# Patient Record
Sex: Female | Born: 1965 | Race: White | Hispanic: No | Smoking: Former smoker
Health system: Southern US, Community
[De-identification: ages and names within clinical notes are randomized; demographics above are authoritative.]

## PROBLEM LIST (undated history)

## (undated) DIAGNOSIS — F909 Attention-deficit hyperactivity disorder, unspecified type: Secondary | ICD-10-CM

## (undated) DIAGNOSIS — E039 Hypothyroidism, unspecified: Secondary | ICD-10-CM

## (undated) DIAGNOSIS — S61218A Laceration without foreign body of other finger without damage to nail, initial encounter: Secondary | ICD-10-CM

## (undated) DIAGNOSIS — F319 Bipolar disorder, unspecified: Secondary | ICD-10-CM

## (undated) DIAGNOSIS — F32A Depression, unspecified: Secondary | ICD-10-CM

## (undated) DIAGNOSIS — E119 Type 2 diabetes mellitus without complications: Secondary | ICD-10-CM

## (undated) DIAGNOSIS — K219 Gastro-esophageal reflux disease without esophagitis: Secondary | ICD-10-CM

## (undated) DIAGNOSIS — F419 Anxiety disorder, unspecified: Secondary | ICD-10-CM

## (undated) DIAGNOSIS — A159 Respiratory tuberculosis unspecified: Secondary | ICD-10-CM

## (undated) DIAGNOSIS — F988 Other specified behavioral and emotional disorders with onset usually occurring in childhood and adolescence: Secondary | ICD-10-CM

## (undated) DIAGNOSIS — G4700 Insomnia, unspecified: Secondary | ICD-10-CM

## (undated) DIAGNOSIS — F329 Major depressive disorder, single episode, unspecified: Secondary | ICD-10-CM

## (undated) DIAGNOSIS — I10 Essential (primary) hypertension: Secondary | ICD-10-CM

## (undated) DIAGNOSIS — E785 Hyperlipidemia, unspecified: Secondary | ICD-10-CM

## (undated) HISTORY — PX: POLYPECTOMY: SHX149

## (undated) HISTORY — PX: COLONOSCOPY: SHX174

## (undated) HISTORY — DX: Bipolar disorder, unspecified: F31.9

## (undated) HISTORY — PX: BREAST SURGERY: SHX581

## (undated) HISTORY — PX: WISDOM TOOTH EXTRACTION: SHX21

## (undated) HISTORY — PX: DILATION AND CURETTAGE OF UTERUS: SHX78

## (undated) HISTORY — DX: Attention-deficit hyperactivity disorder, unspecified type: F90.9

---

## 1998-11-17 ENCOUNTER — Inpatient Hospital Stay (HOSPITAL_COMMUNITY): Admission: AD | Admit: 1998-11-17 | Discharge: 1998-11-20 | Payer: Self-pay | Admitting: Internal Medicine

## 1998-12-06 ENCOUNTER — Encounter: Admission: RE | Admit: 1998-12-06 | Discharge: 1999-03-06 | Payer: Self-pay | Admitting: Internal Medicine

## 1999-04-20 ENCOUNTER — Ambulatory Visit (HOSPITAL_COMMUNITY): Admission: RE | Admit: 1999-04-20 | Discharge: 1999-04-20 | Payer: Self-pay | Admitting: Obstetrics and Gynecology

## 1999-10-20 ENCOUNTER — Encounter: Payer: Self-pay | Admitting: Urology

## 1999-10-20 ENCOUNTER — Encounter: Admission: RE | Admit: 1999-10-20 | Discharge: 1999-10-20 | Payer: Self-pay | Admitting: Urology

## 1999-11-15 ENCOUNTER — Ambulatory Visit (HOSPITAL_COMMUNITY): Admission: RE | Admit: 1999-11-15 | Discharge: 1999-11-15 | Payer: Self-pay | Admitting: Urology

## 2001-02-01 ENCOUNTER — Other Ambulatory Visit: Admission: RE | Admit: 2001-02-01 | Discharge: 2001-02-01 | Payer: Self-pay | Admitting: Obstetrics and Gynecology

## 2001-06-16 ENCOUNTER — Inpatient Hospital Stay (HOSPITAL_COMMUNITY): Admission: EM | Admit: 2001-06-16 | Discharge: 2001-06-17 | Payer: Self-pay | Admitting: Emergency Medicine

## 2001-06-24 ENCOUNTER — Other Ambulatory Visit (HOSPITAL_COMMUNITY): Admission: RE | Admit: 2001-06-24 | Discharge: 2001-06-24 | Payer: Self-pay | Admitting: Psychiatry

## 2002-01-27 ENCOUNTER — Encounter: Payer: Self-pay | Admitting: Family Medicine

## 2002-01-27 ENCOUNTER — Ambulatory Visit (HOSPITAL_COMMUNITY): Admission: RE | Admit: 2002-01-27 | Discharge: 2002-01-27 | Payer: Self-pay | Admitting: Family Medicine

## 2002-09-15 ENCOUNTER — Encounter: Payer: Self-pay | Admitting: Emergency Medicine

## 2002-09-15 ENCOUNTER — Emergency Department (HOSPITAL_COMMUNITY): Admission: EM | Admit: 2002-09-15 | Discharge: 2002-09-15 | Payer: Self-pay | Admitting: Emergency Medicine

## 2002-09-15 ENCOUNTER — Observation Stay (HOSPITAL_COMMUNITY): Admission: EM | Admit: 2002-09-15 | Discharge: 2002-09-16 | Payer: Self-pay | Admitting: Emergency Medicine

## 2003-02-05 ENCOUNTER — Other Ambulatory Visit: Admission: RE | Admit: 2003-02-05 | Discharge: 2003-02-05 | Payer: Self-pay | Admitting: Obstetrics and Gynecology

## 2004-03-03 ENCOUNTER — Encounter: Admission: RE | Admit: 2004-03-03 | Discharge: 2004-03-03 | Payer: Self-pay | Admitting: Obstetrics and Gynecology

## 2004-03-29 ENCOUNTER — Other Ambulatory Visit: Admission: RE | Admit: 2004-03-29 | Discharge: 2004-03-29 | Payer: Self-pay | Admitting: Obstetrics and Gynecology

## 2004-04-08 ENCOUNTER — Ambulatory Visit (HOSPITAL_COMMUNITY): Admission: RE | Admit: 2004-04-08 | Discharge: 2004-04-08 | Payer: Self-pay | Admitting: General Surgery

## 2004-05-09 ENCOUNTER — Encounter: Admission: RE | Admit: 2004-05-09 | Discharge: 2004-05-09 | Payer: Self-pay | Admitting: Gastroenterology

## 2004-06-14 ENCOUNTER — Encounter: Admission: RE | Admit: 2004-06-14 | Discharge: 2004-06-14 | Payer: Self-pay | Admitting: Obstetrics and Gynecology

## 2004-08-23 ENCOUNTER — Encounter: Admission: RE | Admit: 2004-08-23 | Discharge: 2004-08-23 | Payer: Self-pay | Admitting: Emergency Medicine

## 2005-03-12 ENCOUNTER — Emergency Department (HOSPITAL_COMMUNITY): Admission: EM | Admit: 2005-03-12 | Discharge: 2005-03-12 | Payer: Self-pay

## 2005-03-29 ENCOUNTER — Encounter: Admission: RE | Admit: 2005-03-29 | Discharge: 2005-03-29 | Payer: Self-pay | Admitting: Family Medicine

## 2005-05-24 ENCOUNTER — Other Ambulatory Visit: Admission: RE | Admit: 2005-05-24 | Discharge: 2005-05-24 | Payer: Self-pay | Admitting: Obstetrics and Gynecology

## 2005-07-31 ENCOUNTER — Encounter: Admission: RE | Admit: 2005-07-31 | Discharge: 2005-07-31 | Payer: Self-pay | Admitting: Family Medicine

## 2005-08-08 ENCOUNTER — Encounter: Admission: RE | Admit: 2005-08-08 | Discharge: 2005-08-08 | Payer: Self-pay | Admitting: *Deleted

## 2005-08-09 ENCOUNTER — Ambulatory Visit (HOSPITAL_COMMUNITY): Admission: RE | Admit: 2005-08-09 | Discharge: 2005-08-09 | Payer: Self-pay | Admitting: *Deleted

## 2011-12-01 ENCOUNTER — Ambulatory Visit (INDEPENDENT_AMBULATORY_CARE_PROVIDER_SITE_OTHER): Payer: BC Managed Care – PPO | Admitting: General Surgery

## 2013-05-29 ENCOUNTER — Other Ambulatory Visit: Payer: Self-pay | Admitting: Family Medicine

## 2013-05-29 ENCOUNTER — Ambulatory Visit
Admission: RE | Admit: 2013-05-29 | Discharge: 2013-05-29 | Disposition: A | Discharge status: Home / Self Care | Payer: BC Managed Care – PPO | Source: Ambulatory Visit | Attending: Family Medicine | Admitting: Family Medicine

## 2013-05-29 DIAGNOSIS — R7611 Nonspecific reaction to tuberculin skin test without active tuberculosis: Secondary | ICD-10-CM

## 2013-06-11 ENCOUNTER — Other Ambulatory Visit (HOSPITAL_COMMUNITY)
Admission: RE | Admit: 2013-06-11 | Discharge: 2013-06-11 | Disposition: A | Discharge status: Home / Self Care | Payer: BC Managed Care – PPO | Source: Ambulatory Visit | Attending: Obstetrics & Gynecology | Admitting: Obstetrics & Gynecology

## 2013-06-11 ENCOUNTER — Other Ambulatory Visit: Payer: Self-pay | Admitting: Obstetrics & Gynecology

## 2013-06-11 ENCOUNTER — Other Ambulatory Visit: Payer: Self-pay

## 2013-06-11 DIAGNOSIS — Z01419 Encounter for gynecological examination (general) (routine) without abnormal findings: Secondary | ICD-10-CM | POA: Insufficient documentation

## 2013-06-11 DIAGNOSIS — Z1231 Encounter for screening mammogram for malignant neoplasm of breast: Secondary | ICD-10-CM

## 2013-06-11 DIAGNOSIS — Z1151 Encounter for screening for human papillomavirus (HPV): Secondary | ICD-10-CM | POA: Insufficient documentation

## 2013-06-27 ENCOUNTER — Ambulatory Visit
Admission: RE | Admit: 2013-06-27 | Discharge: 2013-06-27 | Disposition: A | Discharge status: Home / Self Care | Payer: BC Managed Care – PPO | Source: Ambulatory Visit

## 2013-06-27 DIAGNOSIS — Z1231 Encounter for screening mammogram for malignant neoplasm of breast: Secondary | ICD-10-CM

## 2013-07-16 ENCOUNTER — Other Ambulatory Visit: Payer: Self-pay | Admitting: Obstetrics & Gynecology

## 2013-07-26 NOTE — H&P (Addendum)
GYN ADMIT History and Physical   HPI. 47 y.o. G0 who presents for hysteroscopy D&C with polypectomy and Novasure ablation.  The patient reports abnormal uterine bleeding with brown-tinged discharge in between her periods. She says this has been going on now for several years.  The patient has had a h/o uterine polyps requiring surgical removal on several occasions and per patient they were never cancerous.  An SHG confirmed polyp within the endometrium and an EMB performed showed no evidence of malignancy or hyperplasia.  no  N/V, no urinary complaints, no changes in bowel habits.  Denies: F/C, SOB,CP, Abdominal pain.  OBHx:nullip GYNHx: LMP: 07/03/13, Menses q 35 days with 4 days of bleeding usually requiring more than 5 pads per day on the heavy days.  no hx of STI, last PAP: 06/13/13- negative, failed IVF in her 23s, not using contraception currently PMHx: Type II DM, HTN, HLD, obesity, bipolar d/o, ADD, h/o PCOS SxHx: D&Cx2, left breast dermoid cyst removal (2013) FHx: no ovarian CA, no colon CA, no breast CA, no Uterine CA SocHx: former smoker- quit 2014 Meds: Adderall, Xanax, ASA (to be discontinued 1wk prior to surgery), MVI, depakote, valtrex, symbyax, ambien, metformin HCL, HCTZ/Lisinopril, Trilipix, Crestor Allergies: PCN (rash); Codeine (SOB)   O:   Gen: NAD, A&Ox3 Heart/Lungs: S1S2 RRR, CTAB Abdomen: morbidly obese,  no tenderness,  no rigidity,  no guarding,  + bowel sounds Pelvis: (examination performed 06/11/13) No external genital lesions/rashes. Vaginal mucosa moist pink well rugated  w/out lesions,  no discharge,  no blood, cervix visualized closed with no lesions,  no CMT, uterus anteverted, nontender.  No adnexal fullness appreciated Extrem: no edema, no calf tenderness LE bilaterally  Labs:  Imaging: SHG (April 2012): Uterus 6.4x4.5x3.6cm. Endometrium 6.53mm with echogenic focus. Filling defect seen c/w polyps. Ovaries WNL. 11/5: EMB: negative for malignancy or  hyperplasia   A/P: 47 y.o. G0P0 who presents for hysteroscopy, D&C, polypectomy and endometrial ablation. -NPO -no antibiotic prophylaxis required -CBC to be completed -SCDs to OR -Risk, benefit, indications and alternative discussed with patient including risk of bleeding, cramping and uterine performation.    Myna Hidalgo, DO [phone] (pager) [phone] (office)

## 2013-08-04 ENCOUNTER — Encounter (HOSPITAL_COMMUNITY): Payer: Self-pay | Admitting: Pharmacist

## 2013-08-13 ENCOUNTER — Encounter (HOSPITAL_COMMUNITY): Payer: Self-pay

## 2013-08-13 ENCOUNTER — Other Ambulatory Visit: Payer: Self-pay

## 2013-08-13 ENCOUNTER — Encounter (HOSPITAL_COMMUNITY)
Admission: RE | Admit: 2013-08-13 | Discharge: 2013-08-13 | Disposition: A | Discharge status: Home / Self Care | Payer: BC Managed Care – PPO | Source: Ambulatory Visit | Attending: Obstetrics & Gynecology | Admitting: Obstetrics & Gynecology

## 2013-08-13 DIAGNOSIS — Z01810 Encounter for preprocedural cardiovascular examination: Secondary | ICD-10-CM | POA: Insufficient documentation

## 2013-08-13 DIAGNOSIS — Z01818 Encounter for other preprocedural examination: Secondary | ICD-10-CM | POA: Insufficient documentation

## 2013-08-13 DIAGNOSIS — Z01812 Encounter for preprocedural laboratory examination: Secondary | ICD-10-CM | POA: Insufficient documentation

## 2013-08-13 HISTORY — DX: Insomnia, unspecified: G47.00

## 2013-08-13 HISTORY — DX: Type 2 diabetes mellitus without complications: E11.9

## 2013-08-13 HISTORY — DX: Major depressive disorder, single episode, unspecified: F32.9

## 2013-08-13 HISTORY — DX: Depression, unspecified: F32.A

## 2013-08-13 HISTORY — DX: Essential (primary) hypertension: I10

## 2013-08-13 HISTORY — DX: Anxiety disorder, unspecified: F41.9

## 2013-08-13 HISTORY — DX: Other specified behavioral and emotional disorders with onset usually occurring in childhood and adolescence: F98.8

## 2013-08-13 HISTORY — DX: Hyperlipidemia, unspecified: E78.5

## 2013-08-13 HISTORY — DX: Respiratory tuberculosis unspecified: A15.9

## 2013-08-13 LAB — BASIC METABOLIC PANEL
Chloride: 100 mEq/L (ref 96–112)
Creatinine, Ser: 0.74 mg/dL (ref 0.50–1.10)
GFR calc Af Amer: >90 mL/min (ref >90)
GFR calc non Af Amer: >90 mL/min (ref >90)
Glucose, Bld: 166 mg/dL — ABNORMAL HIGH (ref 70–99)
Potassium: 4.5 mEq/L (ref 3.5–5.1)
Sodium: 136 mEq/L (ref 135–145)

## 2013-08-13 LAB — CBC
HCT: 39.4 % (ref 36.0–46.0)
Hemoglobin: 12.9 g/dL (ref 12.0–15.0)
MCH: 27.9 pg (ref 26.0–34.0)
MCHC: 32.7 g/dL (ref 30.0–36.0)
Platelets: 304 10*3/uL (ref 150–400)
RBC: 4.62 MIL/uL (ref 3.87–5.11)
RDW: 14 % (ref 11.5–15.5)
WBC: 8.3 10*3/uL (ref 4.0–10.5)

## 2013-08-13 NOTE — Patient Instructions (Addendum)
   Your procedure is scheduled on:  Tuesday, Dec 9  Enter through the [employer] of [facility] at: 1130 am Pick up the phone at the desk and dial [phone] and inform us of your arrival.  Please call this number if you have any problems the morning of surgery: [phone]  Remember: Do not eat food after midnight: Monday Do not drink clear liquids after: 9 am Tuesday, day of surgery Take these medicines the morning of surgery with a SIP OF WATER:  Lisinopril.  Patient instructed to withhold metformin Monday night and Tuesday am dise - day of surfey.  Do not wear jewelry, make-up, or FINGER nail polish No metal in your hair or on your body. Do not wear lotions, powders, perfumes. You may wear deodorant.  Please use your CHG wash as directed prior to surgery.  Do not shave anywhere for at least 12 hours prior to first CHG shower.  Do not bring valuables to the hospital. Contacts, dentures or bridgework may not be worn into surgery.  Patients discharged on the day of surgery will not be allowed to drive home.   Home with boyfriend Noralee Stain.

## 2013-08-19 ENCOUNTER — Encounter (HOSPITAL_COMMUNITY): Payer: Self-pay | Admitting: Anesthesiology

## 2013-08-19 ENCOUNTER — Encounter (HOSPITAL_COMMUNITY): Payer: BC Managed Care – PPO | Admitting: Anesthesiology

## 2013-08-19 ENCOUNTER — Ambulatory Visit (HOSPITAL_COMMUNITY)
Admission: RE | Admit: 2013-08-19 | Discharge: 2013-08-19 | Disposition: A | Discharge status: Home / Self Care | Payer: BC Managed Care – PPO | Source: Ambulatory Visit | Attending: Obstetrics & Gynecology | Admitting: Obstetrics & Gynecology

## 2013-08-19 ENCOUNTER — Encounter (HOSPITAL_COMMUNITY)
Admission: RE | Disposition: A | Discharge status: Home / Self Care | Payer: Self-pay | Source: Ambulatory Visit | Attending: Obstetrics & Gynecology

## 2013-08-19 ENCOUNTER — Ambulatory Visit (HOSPITAL_COMMUNITY): Payer: BC Managed Care – PPO | Admitting: Anesthesiology

## 2013-08-19 DIAGNOSIS — N938 Other specified abnormal uterine and vaginal bleeding: Secondary | ICD-10-CM | POA: Insufficient documentation

## 2013-08-19 DIAGNOSIS — N840 Polyp of corpus uteri: Secondary | ICD-10-CM | POA: Insufficient documentation

## 2013-08-19 DIAGNOSIS — N949 Unspecified condition associated with female genital organs and menstrual cycle: Secondary | ICD-10-CM | POA: Insufficient documentation

## 2013-08-19 HISTORY — PX: DILATATION & CURETTAGE/HYSTEROSCOPY WITH TRUECLEAR: SHX6353

## 2013-08-19 HISTORY — PX: DILITATION & CURRETTAGE/HYSTROSCOPY WITH NOVASURE ABLATION: SHX5568

## 2013-08-19 HISTORY — PX: CERVICAL POLYPECTOMY: SHX88

## 2013-08-19 LAB — GLUCOSE, CAPILLARY
Glucose-Capillary: 126 mg/dL — ABNORMAL HIGH (ref 70–99)
Glucose-Capillary: 113 mg/dL — ABNORMAL HIGH (ref 70–99)

## 2013-08-19 LAB — PREGNANCY, URINE: Preg Test, Ur: NEGATIVE

## 2013-08-19 SURGERY — DILATATION & CURETTAGE/HYSTEROSCOPY WITH NOVASURE ABLATION
Anesthesia: General | Site: Uterus

## 2013-08-19 MED ORDER — KETOROLAC TROMETHAMINE 30 MG/ML IJ SOLN
INTRAMUSCULAR | Status: DC | PRN
  Administered 2013-08-19: 30 mg via INTRAVENOUS

## 2013-08-19 MED ORDER — KETOROLAC TROMETHAMINE 30 MG/ML IJ SOLN: 15.0000 mg | Freq: Once | INTRAMUSCULAR | Status: DC | PRN

## 2013-08-19 MED ORDER — LACTATED RINGERS IV SOLN
INTRAVENOUS | Status: DC
  Administered 2013-08-19: 13:00:00 via INTRAVENOUS

## 2013-08-19 MED ORDER — ONDANSETRON HCL 4 MG/2ML IJ SOLN
INTRAMUSCULAR | Status: DC | PRN
  Administered 2013-08-19: 4 mg via INTRAVENOUS

## 2013-08-19 MED ORDER — LACTATED RINGERS IV SOLN: INTRAVENOUS | Status: DC

## 2013-08-19 MED ORDER — LIDOCAINE HCL (CARDIAC) 20 MG/ML IV SOLN
INTRAVENOUS | Status: AC
  Filled 2013-08-19: qty 5

## 2013-08-19 MED ORDER — ONDANSETRON HCL 4 MG/2ML IJ SOLN
INTRAMUSCULAR | Status: AC
  Filled 2013-08-19: qty 2

## 2013-08-19 MED ORDER — SODIUM CHLORIDE 0.9 % IR SOLN
Status: DC | PRN
  Administered 2013-08-19: 3000 mL

## 2013-08-19 MED ORDER — LIDOCAINE HCL (CARDIAC) 20 MG/ML IV SOLN
INTRAVENOUS | Status: DC | PRN
  Administered 2013-08-19: 60 mg via INTRAVENOUS

## 2013-08-19 MED ORDER — FENTANYL CITRATE 0.05 MG/ML IJ SOLN
INTRAMUSCULAR | Status: AC
  Filled 2013-08-19: qty 5

## 2013-08-19 MED ORDER — PROPOFOL 10 MG/ML IV EMUL
INTRAVENOUS | Status: AC
  Filled 2013-08-19: qty 20

## 2013-08-19 MED ORDER — MIDAZOLAM HCL 2 MG/2ML IJ SOLN
INTRAMUSCULAR | Status: AC
  Filled 2013-08-19: qty 2

## 2013-08-19 MED ORDER — FENTANYL CITRATE 0.05 MG/ML IJ SOLN
25.0000 ug | INTRAMUSCULAR | Status: DC | PRN
  Administered 2013-08-19: 50 ug via INTRAVENOUS

## 2013-08-19 MED ORDER — MIDAZOLAM HCL 2 MG/2ML IJ SOLN
INTRAMUSCULAR | Status: DC | PRN
  Administered 2013-08-19 (×2): 1 mg via INTRAVENOUS

## 2013-08-19 MED ORDER — FENTANYL CITRATE 0.05 MG/ML IJ SOLN
INTRAMUSCULAR | Status: DC | PRN
  Administered 2013-08-19 (×3): 50 ug via INTRAVENOUS

## 2013-08-19 MED ORDER — MEPERIDINE HCL 25 MG/ML IJ SOLN: 6.2500 mg | INTRAMUSCULAR | Status: DC | PRN

## 2013-08-19 MED ORDER — KETOROLAC TROMETHAMINE 30 MG/ML IJ SOLN
INTRAMUSCULAR | Status: AC
  Filled 2013-08-19: qty 1

## 2013-08-19 MED ORDER — ONDANSETRON HCL 4 MG/2ML IJ SOLN: 4.0000 mg | Freq: Once | INTRAMUSCULAR | Status: DC | PRN

## 2013-08-19 MED ORDER — FENTANYL CITRATE 0.05 MG/ML IJ SOLN
INTRAMUSCULAR | Status: AC
  Administered 2013-08-19: 50 ug via INTRAVENOUS
  Filled 2013-08-19: qty 2

## 2013-08-19 MED ORDER — PROPOFOL 10 MG/ML IV BOLUS
INTRAVENOUS | Status: DC | PRN
  Administered 2013-08-19: 200 mg via INTRAVENOUS
  Administered 2013-08-19: 50 mg via INTRAVENOUS

## 2013-08-19 SURGICAL SUPPLY — 18 items
ABLATOR ENDOMETRIAL BIPOLAR (ABLATOR) ×3 IMPLANT
BLADE INCISOR TRUC PLUS 2.9 (ABLATOR) ×2 IMPLANT
CANISTER SUCT 3000ML (MISCELLANEOUS) ×3 IMPLANT
CATH ROBINSON RED A/P 16FR (CATHETERS) ×3 IMPLANT
CLOTH BEACON ORANGE TIMEOUT ST (SAFETY) ×3 IMPLANT
CONTAINER PREFILL 10% NBF 60ML (FORM) ×6 IMPLANT
DILATOR CANAL MILEX (MISCELLANEOUS) IMPLANT
DRESSING TELFA 8X3 (GAUZE/BANDAGES/DRESSINGS) ×3 IMPLANT
GLOVE BIOGEL PI IND STRL 6.5 (GLOVE) ×4 IMPLANT
GLOVE BIOGEL PI INDICATOR 6.5 (GLOVE) ×2
GLOVE ECLIPSE 6.5 STRL STRAW (GLOVE) ×3 IMPLANT
GOWN STRL REIN XL XLG (GOWN DISPOSABLE) ×6 IMPLANT
INCISOR TRUC PLUS BLADE 2.9 (ABLATOR) ×3
KIT HYSTEROSCOPY TRUCLEAR (ABLATOR) ×3 IMPLANT
PACK HYSTEROSCOPY LF (CUSTOM PROCEDURE TRAY) ×3 IMPLANT
PAD OB MATERNITY 4.3X12.25 (PERSONAL CARE ITEMS) ×3 IMPLANT
TOWEL OR 17X24 6PK STRL BLUE (TOWEL DISPOSABLE) ×6 IMPLANT
WATER STERILE IRR 1000ML POUR (IV SOLUTION) ×3 IMPLANT

## 2013-08-19 NOTE — Op Note (Signed)
Operative Report  PreOp: Abnoraml Uterine bleeding, Uterine polyp PostOp: same Procedure:  Hysteroscopy, Truclear polypectomy, Novasure Endometrial ablation Surgeon: Dr. Myna Hidalgo Anesthesia: General Complications:none EBL: <10cc UOP: 100cc IVF:800cc  Findings:9wk sized anteverted uterus with proliferative endometrium.  Ostia visualized bilaterally.  On left side wide 2cm polyp with wide base seen  Specimens: uterine polyp  Indications: 47 y.o. G0 who presents for hysteroscopy D&C with polypectomy and Novasure ablation. Pt reports AUB with a known h/o uterine polyps.  An SHG confirmed polyp within the endometrium and an EMB performed showed no evidence of malignancy or hyperplasia.   Procedure: The patient was taken to the operating room where she underwent general anesthesia without difficulty. The patient was placed in a low lithotomy position using Allen stirrups. The patient was examined with the findings as noted above.  She was then prepped and draped in the normal sterile fashion. The bladder was drained using a red rubber urethral catheter. A sterile speculum was inserted into the vagina. A single tooth tenaculum was placed on the anterior lip of the cervix. The uterus was then sounded to 9cm. The endocervical canal was then serially dilated to allow passage of a 5mm hysteroscope.  The Truclear hysteroscope was then inserted without difficulty and noted to have the findings as listed above. The Truclear blade was inserted and the polyp resected without difficulty.  Using the hysteroscope, the cervical length was noted to be 4.5cm. Visualization was achieved using LR as a distending medium. The tissue was sent to pathology.   Attention was then turned to the Novasure. The Novasure was set up according to manufacture instructions. The cavity length was set to 4.5. The Novasure was inserted, seating test performed and the cavity width was noted to be 3cm. Cavity assessment was performed  and passed. The device was then activated for 89sec at a power level of 83. Upon completion, the Novasure was removed and the hysteroscope was reinserted. Global ablation was visualized and no uterine perforation was seen. All instrument were then removed. Hemostasis was observed at the cervical site.  The patient was repositioned to the supine position. The patient tolerated the procedure without any complications and taken to recovery in stable condition.   Myna Hidalgo, DO [phone] (pager) [phone] (office)

## 2013-08-19 NOTE — Anesthesia Postprocedure Evaluation (Signed)
  Anesthesia Post-op Note  Patient: Cheyenne Mccoy  Procedure(s) Performed: Procedure(s): DILATATION & CURETTAGE/HYSTEROSCOPY WITH NOVASURE ABLATION (N/A) endometrial POLYPECTOMY (N/A) DILATATION & CURETTAGE/HYSTEROSCOPY WITH TRUCLEAR (N/A)  Patient Location: PACU  Anesthesia Type:General  Level of Consciousness: awake, alert  and oriented  Airway and Oxygen Therapy: Patient Spontanous Breathing  Post-op Pain: mild  Post-op Assessment: Post-op Vital signs reviewed, Patient's Cardiovascular Status Stable, Respiratory Function Stable, Patent Airway, No signs of Nausea or vomiting and Pain level controlled  Post-op Vital Signs: Reviewed and stable  Complications: No apparent anesthesia complications

## 2013-08-19 NOTE — Anesthesia Preprocedure Evaluation (Signed)
Anesthesia Evaluation  Patient identified by MRN, date of birth, ID band Patient awake    Reviewed: Allergy & Precautions, H&P , NPO status , Patient's Chart, lab work & pertinent test results  Airway Mallampati: II TM Distance: >3 FB Neck ROM: full    Dental no notable dental hx. (+) Teeth Intact   Pulmonary former smoker,    Pulmonary exam normal       Cardiovascular hypertension, Pt. on medications     Neuro/Psych negative neurological ROS     GI/Hepatic negative GI ROS, Neg liver ROS,   Endo/Other  diabetes, Type 2, Oral Hypoglycemic AgentsMorbid obesity  Renal/GU negative Renal ROS     Musculoskeletal negative musculoskeletal ROS (+)   Abdominal (+) + obese,   Peds  Hematology negative hematology ROS (+)   Anesthesia Other Findings   Reproductive/Obstetrics negative OB ROS                           Anesthesia Physical Anesthesia Plan  ASA: III  Anesthesia Plan: General   Post-op Pain Management:    Induction: Intravenous  Airway Management Planned: LMA  Additional Equipment:   Intra-op Plan:   Post-operative Plan:   Informed Consent: I have reviewed the patients History and Physical, chart, labs and discussed the procedure including the risks, benefits and alternatives for the proposed anesthesia with the patient or authorized representative who has indicated his/her understanding and acceptance.     Plan Discussed with: CRNA and Surgeon  Anesthesia Plan Comments:         Anesthesia Quick Evaluation

## 2013-08-19 NOTE — Transfer of Care (Signed)
Immediate Anesthesia Transfer of Care Note  Patient: Noheli R Winbush  Procedure(s) Performed: Procedure(s): DILATATION & CURETTAGE/HYSTEROSCOPY WITH NOVASURE ABLATION (N/A) endometrial POLYPECTOMY (N/A) DILATATION & CURETTAGE/HYSTEROSCOPY WITH TRUCLEAR (N/A)  Patient Location: PACU  Anesthesia Type:General  Level of Consciousness: awake, alert  and oriented  Airway & Oxygen Therapy: Patient Spontanous Breathing and Patient connected to nasal cannula oxygen  Post-op Assessment: Report given to PACU RN and Post -op Vital signs reviewed and stable  Post vital signs: stable  Complications: No apparent anesthesia complications

## 2013-08-19 NOTE — Interval H&P Note (Signed)
History and Physical Interval Note:  08/19/2013 12:32 PM  Cheyenne Mccoy  has presented today for surgery, with the diagnosis of AUB  The various methods of treatment have been discussed with the patient. After consideration of risks, benefits and other options for treatment, the patient has consented to    Procedure(s): DILATATION & CURETTAGE/HYSTEROSCOPY WITH NOVASURE ABLATION (N/A) Uterine POLYPECTOMY (N/A) as a surgical intervention.  O: BP 134/84  Pulse 79  Temp(Src) 98.1 F (36.7 C) (Oral)  Resp 20  SpO2 98% Gen: NAD CV: RRR Lungs: CTAB Abd: soft, non-tender GU: Deferred to OR Ext: no calf tenderness bilaterally  CBC    Component Value Date/Time   WBC 8.3 08/13/2013 0845   RBC 4.62 08/13/2013 0845   HGB 12.9 08/13/2013 0845   HCT 39.4 08/13/2013 0845   PLT 304 08/13/2013 0845   MCV 85.3 08/13/2013 0845   MCH 27.9 08/13/2013 0845   MCHC 32.7 08/13/2013 0845   RDW 14.0 08/13/2013 0845   A/P: 47 y.o. G0 who presents for hysteroscopy, D&C, polypectomy and endometrial ablation. The patient's history has been reviewed, patient examined, no change in status, stable for surgery.  I have reviewed the patient's chart.  CBC performed on 12/3.  Urine pregnancy negative today. Questions were answered to the patient's satisfaction.     Myna Hidalgo, M

## 2013-08-20 ENCOUNTER — Encounter (HOSPITAL_COMMUNITY): Payer: Self-pay | Admitting: Obstetrics & Gynecology

## 2014-03-03 ENCOUNTER — Other Ambulatory Visit: Payer: Self-pay | Admitting: Family Medicine

## 2014-03-03 DIAGNOSIS — R1011 Right upper quadrant pain: Secondary | ICD-10-CM

## 2014-03-04 ENCOUNTER — Ambulatory Visit
Admission: RE | Admit: 2014-03-04 | Discharge: 2014-03-04 | Disposition: A | Discharge status: Home / Self Care | Payer: BC Managed Care – PPO | Source: Ambulatory Visit | Attending: Family Medicine | Admitting: Family Medicine

## 2014-03-04 DIAGNOSIS — R1011 Right upper quadrant pain: Secondary | ICD-10-CM

## 2015-06-18 ENCOUNTER — Other Ambulatory Visit: Payer: Self-pay | Admitting: Otolaryngology

## 2015-06-18 DIAGNOSIS — H903 Sensorineural hearing loss, bilateral: Secondary | ICD-10-CM

## 2015-06-18 DIAGNOSIS — H905 Unspecified sensorineural hearing loss: Secondary | ICD-10-CM

## 2015-06-30 ENCOUNTER — Other Ambulatory Visit
Admission: RE | Admit: 2015-06-30 | Discharge: 2015-06-30 | Disposition: A | Discharge status: Home / Self Care | Payer: BLUE CROSS/BLUE SHIELD | Source: Ambulatory Visit | Attending: Otolaryngology | Admitting: Otolaryngology

## 2015-06-30 ENCOUNTER — Ambulatory Visit
Admission: RE | Admit: 2015-06-30 | Discharge: 2015-06-30 | Disposition: A | Discharge status: Home / Self Care | Payer: BLUE CROSS/BLUE SHIELD | Source: Ambulatory Visit | Attending: Otolaryngology | Admitting: Otolaryngology

## 2015-06-30 DIAGNOSIS — H905 Unspecified sensorineural hearing loss: Secondary | ICD-10-CM | POA: Insufficient documentation

## 2015-06-30 DIAGNOSIS — R600 Localized edema: Secondary | ICD-10-CM | POA: Insufficient documentation

## 2015-06-30 DIAGNOSIS — H9041 Sensorineural hearing loss, unilateral, right ear, with unrestricted hearing on the contralateral side: Secondary | ICD-10-CM | POA: Diagnosis present

## 2015-06-30 DIAGNOSIS — H903 Sensorineural hearing loss, bilateral: Secondary | ICD-10-CM

## 2015-06-30 LAB — CREATININE, SERUM
Creatinine, Ser: 0.82 mg/dL (ref 0.44–1.00)
GFR calc non Af Amer: >60 mL/min (ref >60)

## 2015-06-30 MED ORDER — GADOBENATE DIMEGLUMINE 529 MG/ML IV SOLN
20.0000 mL | Freq: Once | INTRAVENOUS | Status: AC | PRN
Start: 2015-06-30 — End: 2015-06-30
  Administered 2015-06-30: 20 mL via INTRAVENOUS

## 2015-09-24 ENCOUNTER — Other Ambulatory Visit: Payer: Self-pay

## 2015-09-24 DIAGNOSIS — Z1231 Encounter for screening mammogram for malignant neoplasm of breast: Secondary | ICD-10-CM

## 2015-09-29 ENCOUNTER — Other Ambulatory Visit: Payer: Self-pay | Admitting: Obstetrics & Gynecology

## 2015-09-29 ENCOUNTER — Other Ambulatory Visit (HOSPITAL_COMMUNITY)
Admission: RE | Admit: 2015-09-29 | Discharge: 2015-09-29 | Disposition: A | Discharge status: Home / Self Care | Payer: BLUE CROSS/BLUE SHIELD | Source: Ambulatory Visit | Attending: Obstetrics and Gynecology | Admitting: Obstetrics and Gynecology

## 2015-09-29 DIAGNOSIS — Z01419 Encounter for gynecological examination (general) (routine) without abnormal findings: Secondary | ICD-10-CM | POA: Diagnosis not present

## 2015-10-01 ENCOUNTER — Ambulatory Visit: Payer: BC Managed Care – PPO

## 2015-10-01 LAB — CYTOLOGY - PAP

## 2015-10-08 ENCOUNTER — Ambulatory Visit
Admission: RE | Admit: 2015-10-08 | Discharge: 2015-10-08 | Disposition: A | Discharge status: Home / Self Care | Payer: BLUE CROSS/BLUE SHIELD | Source: Ambulatory Visit

## 2015-10-08 DIAGNOSIS — Z1231 Encounter for screening mammogram for malignant neoplasm of breast: Secondary | ICD-10-CM

## 2017-08-27 ENCOUNTER — Institutional Professional Consult (permissible substitution): Payer: Self-pay | Admitting: Psychiatry

## 2017-09-06 ENCOUNTER — Ambulatory Visit: Payer: BLUE CROSS/BLUE SHIELD | Admitting: Psychiatry

## 2017-09-06 ENCOUNTER — Telehealth: Payer: Self-pay

## 2017-09-06 ENCOUNTER — Other Ambulatory Visit: Payer: Self-pay

## 2017-09-06 ENCOUNTER — Encounter: Payer: Self-pay | Admitting: Psychiatry

## 2017-09-06 VITALS — BP 142/86 | Temp 98.6°F | Ht 66.0 in | Wt 271.4 lb

## 2017-09-06 DIAGNOSIS — F332 Major depressive disorder, recurrent severe without psychotic features: Secondary | ICD-10-CM

## 2017-09-06 NOTE — Progress Notes (Signed)
ECT: This is an ECT consult for 51 year old woman referred by her outpatient psychiatrist.  Patient interviewed.  Little information in her computer chart.  Patient was appropriate in her interaction and appeared to be a good historian.  She described lifelong mood problems which escalated dramatically a couple years ago.  In February 2017 she says she had a breakdown and went out of work.  Has not returned to work since then.  She describes her mood as chronically bad.  Most of the time it sounds like she feels depressed and nervous with little motivation or interest and frequent crying spells.  She also however describes what she calls "manic" spells.  As far as I can tell these happen approximately every 2 weeks but only last for a day at a time.  There does not appear to be any psychotic component to these but she will have episodes of increased energy decreased sleep and hyperactive behavior for about a day.  Her mood does not become any better.  Patient says she has had suicidal thoughts but has no intent or plan of acting on them.  Consistently denies any psychotic symptoms.  Is not drinking or abusing any drugs.  Patient is currently being treated with multiple psychiatric medicines including Latuda, Seroquel, Depakote, Xanax, Adderall.  Although she describes herself as extremely impaired she also seems to feel that these medicines are somehow helpful for her.  She has been referred for consideration of ECT.  Patient presents as a neatly groomed casually dressed woman who looks her stated age.  Eye contact somewhat decreased.  Psychomotor activity decreased.  Affect sad but not tearful.  Thoughts appear to be lucid.  Patient appears to understand the nature of the proposed treatment.  We discussed pros and cons and risks and benefits of the treatment.  No evidence of psychosis or delusional thinking.  Denies any hallucinations.  No sign of responding to internal stimuli.  Short and long-term memory  intact.  Cognitively appears to be grossly intact.  Positive family history of depression  As I informed patient based on her chronic severe mood disorder she is a reasonable candidate for ECT although given the complexity of her condition I cannot give her a precise estimate of the likelihood of improvement.  The patient does not have any major contraindications to ECT.  I did discuss with her some of the potential pitfalls such as the fact that she lives pretty much by herself and has little social support and that some of her medicines have anticonvulsant properties.  Patient was uncertain as to how she would be able to work the transportation.  In the end the patient seemed ambivalent about committing to ECT.  She wanted me to give her the order form to get the lab work done but did not want to commit.  She is concerned also about the financial aspect.  At this point I have put her down as a possible tentative start of January 9 although it sounds like if we are to get started its more likely to be later than that.  I will inform the ECT staff and utilization review.  Patient can get lab test done if she wants to proceed.  At that point we will discuss any changes to her medicine.  Patient agrees to plan.

## 2018-07-01 DIAGNOSIS — S61311A Laceration without foreign body of left index finger with damage to nail, initial encounter: Secondary | ICD-10-CM | POA: Insufficient documentation

## 2018-07-01 DIAGNOSIS — M79642 Pain in left hand: Secondary | ICD-10-CM

## 2018-07-01 HISTORY — DX: Pain in left hand: M79.642

## 2018-07-02 ENCOUNTER — Other Ambulatory Visit: Payer: Self-pay

## 2018-07-02 ENCOUNTER — Encounter (HOSPITAL_BASED_OUTPATIENT_CLINIC_OR_DEPARTMENT_OTHER): Payer: Self-pay | Admitting: *Deleted

## 2018-07-02 ENCOUNTER — Other Ambulatory Visit: Payer: Self-pay | Admitting: Orthopedic Surgery

## 2018-07-03 ENCOUNTER — Encounter (HOSPITAL_BASED_OUTPATIENT_CLINIC_OR_DEPARTMENT_OTHER)
Admission: RE | Admit: 2018-07-03 | Discharge: 2018-07-03 | Disposition: A | Discharge status: Home / Self Care | Payer: BLUE CROSS/BLUE SHIELD | Source: Ambulatory Visit | Attending: Orthopedic Surgery | Admitting: Orthopedic Surgery

## 2018-07-03 ENCOUNTER — Other Ambulatory Visit: Payer: Self-pay | Admitting: Orthopedic Surgery

## 2018-07-03 ENCOUNTER — Other Ambulatory Visit: Payer: Self-pay

## 2018-07-03 DIAGNOSIS — R200 Anesthesia of skin: Secondary | ICD-10-CM | POA: Diagnosis present

## 2018-07-03 DIAGNOSIS — Z01810 Encounter for preprocedural cardiovascular examination: Secondary | ICD-10-CM | POA: Diagnosis present

## 2018-07-03 DIAGNOSIS — S61211A Laceration without foreign body of left index finger without damage to nail, initial encounter: Secondary | ICD-10-CM | POA: Diagnosis not present

## 2018-07-03 DIAGNOSIS — Z7984 Long term (current) use of oral hypoglycemic drugs: Secondary | ICD-10-CM | POA: Diagnosis not present

## 2018-07-03 DIAGNOSIS — S64491A Injury of digital nerve of left index finger, initial encounter: Secondary | ICD-10-CM | POA: Diagnosis not present

## 2018-07-03 DIAGNOSIS — X58XXXA Exposure to other specified factors, initial encounter: Secondary | ICD-10-CM | POA: Diagnosis not present

## 2018-07-03 DIAGNOSIS — I10 Essential (primary) hypertension: Secondary | ICD-10-CM | POA: Diagnosis not present

## 2018-07-03 DIAGNOSIS — Y93G3 Activity, cooking and baking: Secondary | ICD-10-CM | POA: Diagnosis not present

## 2018-07-03 DIAGNOSIS — Z6841 Body Mass Index (BMI) 40.0 and over, adult: Secondary | ICD-10-CM | POA: Diagnosis not present

## 2018-07-03 DIAGNOSIS — E119 Type 2 diabetes mellitus without complications: Secondary | ICD-10-CM | POA: Diagnosis not present

## 2018-07-03 DIAGNOSIS — Z87891 Personal history of nicotine dependence: Secondary | ICD-10-CM | POA: Diagnosis not present

## 2018-07-03 LAB — BASIC METABOLIC PANEL
ANION GAP: 6 (ref 5–15)
BUN: 15 mg/dL (ref 6–20)
CHLORIDE: 103 mmol/L (ref 98–111)
CO2: 27 mmol/L (ref 22–32)
CREATININE: 0.77 mg/dL (ref 0.44–1.00)
Calcium: 11.1 mg/dL — ABNORMAL HIGH (ref 8.9–10.3)
GFR calc Af Amer: >60 mL/min (ref >60)
GFR calc non Af Amer: >60 mL/min (ref >60)
Glucose, Bld: 91 mg/dL (ref 70–99)
Potassium: 4.4 mmol/L (ref 3.5–5.1)
SODIUM: 136 mmol/L (ref 135–145)

## 2018-07-04 ENCOUNTER — Ambulatory Visit (HOSPITAL_BASED_OUTPATIENT_CLINIC_OR_DEPARTMENT_OTHER): Payer: BLUE CROSS/BLUE SHIELD | Admitting: Anesthesiology

## 2018-07-04 ENCOUNTER — Ambulatory Visit (HOSPITAL_BASED_OUTPATIENT_CLINIC_OR_DEPARTMENT_OTHER)
Admission: RE | Admit: 2018-07-04 | Discharge: 2018-07-04 | Disposition: A | Discharge status: Home / Self Care | Payer: BLUE CROSS/BLUE SHIELD | Source: Ambulatory Visit | Attending: Orthopedic Surgery | Admitting: Orthopedic Surgery

## 2018-07-04 ENCOUNTER — Encounter (HOSPITAL_BASED_OUTPATIENT_CLINIC_OR_DEPARTMENT_OTHER)
Admission: RE | Disposition: A | Discharge status: Home / Self Care | Payer: Self-pay | Source: Ambulatory Visit | Attending: Orthopedic Surgery

## 2018-07-04 ENCOUNTER — Other Ambulatory Visit: Payer: Self-pay

## 2018-07-04 ENCOUNTER — Encounter (HOSPITAL_BASED_OUTPATIENT_CLINIC_OR_DEPARTMENT_OTHER): Payer: Self-pay | Admitting: *Deleted

## 2018-07-04 DIAGNOSIS — E119 Type 2 diabetes mellitus without complications: Secondary | ICD-10-CM | POA: Insufficient documentation

## 2018-07-04 DIAGNOSIS — S64491A Injury of digital nerve of left index finger, initial encounter: Secondary | ICD-10-CM | POA: Insufficient documentation

## 2018-07-04 DIAGNOSIS — S61211A Laceration without foreign body of left index finger without damage to nail, initial encounter: Secondary | ICD-10-CM | POA: Insufficient documentation

## 2018-07-04 DIAGNOSIS — Z7984 Long term (current) use of oral hypoglycemic drugs: Secondary | ICD-10-CM | POA: Insufficient documentation

## 2018-07-04 DIAGNOSIS — Y93G3 Activity, cooking and baking: Secondary | ICD-10-CM | POA: Insufficient documentation

## 2018-07-04 DIAGNOSIS — Z6841 Body Mass Index (BMI) 40.0 and over, adult: Secondary | ICD-10-CM | POA: Insufficient documentation

## 2018-07-04 DIAGNOSIS — X58XXXA Exposure to other specified factors, initial encounter: Secondary | ICD-10-CM | POA: Insufficient documentation

## 2018-07-04 DIAGNOSIS — Z87891 Personal history of nicotine dependence: Secondary | ICD-10-CM | POA: Insufficient documentation

## 2018-07-04 DIAGNOSIS — I10 Essential (primary) hypertension: Secondary | ICD-10-CM | POA: Insufficient documentation

## 2018-07-04 HISTORY — DX: Hypothyroidism, unspecified: E03.9

## 2018-07-04 HISTORY — PX: NERVE REPAIR: SHX2083

## 2018-07-04 HISTORY — PX: WOUND EXPLORATION: SHX6188

## 2018-07-04 HISTORY — DX: Laceration without foreign body of other finger without damage to nail, initial encounter: S61.218A

## 2018-07-04 LAB — GLUCOSE, CAPILLARY
GLUCOSE-CAPILLARY: 153 mg/dL — AB (ref 70–99)
GLUCOSE-CAPILLARY: 113 mg/dL — AB (ref 70–99)

## 2018-07-04 SURGERY — WOUND EXPLORATION
Anesthesia: General | Site: Finger | Laterality: Left

## 2018-07-04 MED ORDER — LACTATED RINGERS IV SOLN
INTRAVENOUS | Status: DC
  Administered 2018-07-04: 08:00:00 via INTRAVENOUS

## 2018-07-04 MED ORDER — VANCOMYCIN HCL IN DEXTROSE 1-5 GM/200ML-% IV SOLN
1000.0000 mg | INTRAVENOUS | Status: AC
  Administered 2018-07-04: 1000 mg via INTRAVENOUS

## 2018-07-04 MED ORDER — LIDOCAINE HCL (CARDIAC) PF 100 MG/5ML IV SOSY
PREFILLED_SYRINGE | INTRAVENOUS | Status: DC | PRN
  Administered 2018-07-04: 40 mg via INTRAVENOUS

## 2018-07-04 MED ORDER — CHLORHEXIDINE GLUCONATE 4 % EX LIQD: 60.0000 mL | Freq: Once | CUTANEOUS | Status: DC

## 2018-07-04 MED ORDER — MIDAZOLAM HCL 2 MG/2ML IJ SOLN
INTRAMUSCULAR | Status: AC
  Filled 2018-07-04: qty 2

## 2018-07-04 MED ORDER — VANCOMYCIN HCL IN DEXTROSE 1-5 GM/200ML-% IV SOLN
INTRAVENOUS | Status: AC
  Filled 2018-07-04: qty 200

## 2018-07-04 MED ORDER — MIDAZOLAM HCL 2 MG/2ML IJ SOLN
1.0000 mg | INTRAMUSCULAR | Status: DC | PRN
  Administered 2018-07-04: 1 mg via INTRAVENOUS
  Administered 2018-07-04: 2 mg via INTRAVENOUS

## 2018-07-04 MED ORDER — DEXAMETHASONE SODIUM PHOSPHATE 10 MG/ML IJ SOLN
INTRAMUSCULAR | Status: DC | PRN
  Administered 2018-07-04: 10 mg via INTRAVENOUS

## 2018-07-04 MED ORDER — CLONIDINE HCL (ANALGESIA) 100 MCG/ML EP SOLN
EPIDURAL | Status: DC | PRN
  Administered 2018-07-04: 50 ug

## 2018-07-04 MED ORDER — FENTANYL CITRATE (PF) 100 MCG/2ML IJ SOLN
INTRAMUSCULAR | Status: AC
  Filled 2018-07-04: qty 2

## 2018-07-04 MED ORDER — ACETAMINOPHEN 325 MG PO TABS: 325.0000 mg | ORAL_TABLET | ORAL | Status: DC | PRN

## 2018-07-04 MED ORDER — TRAMADOL HCL 50 MG PO TABS: 50.0000 mg | ORAL_TABLET | Freq: Four times a day (QID) | ORAL | 0 refills | Status: DC | PRN

## 2018-07-04 MED ORDER — ONDANSETRON HCL 4 MG/2ML IJ SOLN: 4.0000 mg | Freq: Once | INTRAMUSCULAR | Status: DC | PRN

## 2018-07-04 MED ORDER — ROPIVACAINE HCL 7.5 MG/ML IJ SOLN
INTRAMUSCULAR | Status: DC | PRN
  Administered 2018-07-04: 20 mL via PERINEURAL

## 2018-07-04 MED ORDER — OXYCODONE HCL 5 MG PO TABS: 5.0000 mg | ORAL_TABLET | Freq: Once | ORAL | Status: DC | PRN

## 2018-07-04 MED ORDER — FENTANYL CITRATE (PF) 100 MCG/2ML IJ SOLN: 25.0000 ug | INTRAMUSCULAR | Status: DC | PRN

## 2018-07-04 MED ORDER — MEPERIDINE HCL 25 MG/ML IJ SOLN: 6.2500 mg | INTRAMUSCULAR | Status: DC | PRN

## 2018-07-04 MED ORDER — FENTANYL CITRATE (PF) 100 MCG/2ML IJ SOLN
50.0000 ug | INTRAMUSCULAR | Status: DC | PRN
  Administered 2018-07-04: 100 ug via INTRAVENOUS

## 2018-07-04 MED ORDER — ONDANSETRON HCL 4 MG/2ML IJ SOLN
INTRAMUSCULAR | Status: DC | PRN
  Administered 2018-07-04: 4 mg via INTRAVENOUS

## 2018-07-04 MED ORDER — SCOPOLAMINE 1 MG/3DAYS TD PT72: 1.0000 | MEDICATED_PATCH | Freq: Once | TRANSDERMAL | Status: DC | PRN

## 2018-07-04 MED ORDER — ACETAMINOPHEN 160 MG/5ML PO SOLN: 325.0000 mg | ORAL | Status: DC | PRN

## 2018-07-04 MED ORDER — PROPOFOL 10 MG/ML IV BOLUS
INTRAVENOUS | Status: DC | PRN
  Administered 2018-07-04: 200 mg via INTRAVENOUS

## 2018-07-04 MED ORDER — OXYCODONE HCL 5 MG/5ML PO SOLN: 5.0000 mg | Freq: Once | ORAL | Status: DC | PRN

## 2018-07-04 SURGICAL SUPPLY — 56 items
BAG DECANTER FOR FLEXI CONT (MISCELLANEOUS) IMPLANT
BLADE MINI RND TIP GREEN BEAV (BLADE) IMPLANT
BLADE SURG 15 STRL LF DISP TIS (BLADE) ×1 IMPLANT
BLADE SURG 15 STRL SS (BLADE) ×1
BNDG COHESIVE 3X5 TAN STRL LF (GAUZE/BANDAGES/DRESSINGS) ×2 IMPLANT
BNDG ESMARK 4X9 LF (GAUZE/BANDAGES/DRESSINGS) ×2 IMPLANT
BNDG GAUZE ELAST 4 BULKY (GAUZE/BANDAGES/DRESSINGS) ×2 IMPLANT
CHLORAPREP W/TINT 26ML (MISCELLANEOUS) ×2 IMPLANT
CONNECTOR NERVE AXOGUARD 3X15 (Orthopedic Implant) ×2 IMPLANT
CORD BIPOLAR FORCEPS 12FT (ELECTRODE) ×2 IMPLANT
COVER BACK TABLE 60X90IN (DRAPES) ×2 IMPLANT
COVER MAYO STAND STRL (DRAPES) ×2 IMPLANT
COVER WAND RF STERILE (DRAPES) IMPLANT
CUFF TOURNIQUET SINGLE 18IN (TOURNIQUET CUFF) ×2 IMPLANT
DECANTER SPIKE VIAL GLASS SM (MISCELLANEOUS) ×2 IMPLANT
DRAPE EXTREMITY T 121X128X90 (DRAPE) ×2 IMPLANT
DRAPE SURG 17X23 STRL (DRAPES) ×2 IMPLANT
GAUZE SPONGE 4X4 12PLY STRL (GAUZE/BANDAGES/DRESSINGS) ×2 IMPLANT
GAUZE XEROFORM 1X8 LF (GAUZE/BANDAGES/DRESSINGS) ×2 IMPLANT
GLOVE BIO SURGEON STRL SZ 6.5 (GLOVE) ×2 IMPLANT
GLOVE BIOGEL PI IND STRL 7.0 (GLOVE) ×2 IMPLANT
GLOVE BIOGEL PI IND STRL 8.5 (GLOVE) ×1 IMPLANT
GLOVE BIOGEL PI INDICATOR 7.0 (GLOVE) ×2
GLOVE BIOGEL PI INDICATOR 8.5 (GLOVE) ×1
GLOVE SURG ORTHO 8.0 STRL STRW (GLOVE) ×2 IMPLANT
GOWN STRL REUS W/ TWL LRG LVL3 (GOWN DISPOSABLE) ×1 IMPLANT
GOWN STRL REUS W/TWL LRG LVL3 (GOWN DISPOSABLE) ×1
GOWN STRL REUS W/TWL XL LVL3 (GOWN DISPOSABLE) ×4 IMPLANT
LOOP VESSEL MAXI BLUE (MISCELLANEOUS) IMPLANT
NDL SAFETY ECLIPSE 18X1.5 (NEEDLE) IMPLANT
NEEDLE HYPO 18GX1.5 SHARP (NEEDLE)
NEEDLE PRECISIONGLIDE 27X1.5 (NEEDLE) IMPLANT
NS IRRIG 1000ML POUR BTL (IV SOLUTION) ×2 IMPLANT
PACK BASIN DAY SURGERY FS (CUSTOM PROCEDURE TRAY) ×2 IMPLANT
PAD CAST 3X4 CTTN HI CHSV (CAST SUPPLIES) ×1 IMPLANT
PAD CAST 4YDX4 CTTN HI CHSV (CAST SUPPLIES) ×1 IMPLANT
PADDING CAST ABS 4INX4YD NS (CAST SUPPLIES) ×1
PADDING CAST ABS COTTON 4X4 ST (CAST SUPPLIES) ×1 IMPLANT
PADDING CAST COTTON 3X4 STRL (CAST SUPPLIES) ×1
PADDING CAST COTTON 4X4 STRL (CAST SUPPLIES) ×1
SLEEVE SCD COMPRESS KNEE MED (MISCELLANEOUS) ×2 IMPLANT
SPEAR EYE SURG WECK-CEL (MISCELLANEOUS) ×2 IMPLANT
SPLINT PLASTER CAST XFAST 3X15 (CAST SUPPLIES) IMPLANT
SPLINT PLASTER XTRA FASTSET 3X (CAST SUPPLIES)
STOCKINETTE 4X48 STRL (DRAPES) ×2 IMPLANT
SUT ETHIBOND 3-0 V-5 (SUTURE) IMPLANT
SUT ETHILON 4 0 PS 2 18 (SUTURE) ×2 IMPLANT
SUT NYLON 9 0 VRM6 (SUTURE) ×2 IMPLANT
SUT PROLENE 2 0 SH DA (SUTURE) IMPLANT
SUT SILK 4 0 PS 2 (SUTURE) ×2 IMPLANT
SUT SUPRAMID 3-0 (SUTURE) IMPLANT
SUT VICRYL 4-0 PS2 18IN ABS (SUTURE) IMPLANT
SYR BULB 3OZ (MISCELLANEOUS) ×2 IMPLANT
SYR CONTROL 10ML LL (SYRINGE) IMPLANT
TOWEL GREEN STERILE FF (TOWEL DISPOSABLE) ×4 IMPLANT
UNDERPAD 30X30 (UNDERPADS AND DIAPERS) ×2 IMPLANT

## 2018-07-04 NOTE — Anesthesia Procedure Notes (Signed)
Procedure Name: LMA Insertion Date/Time: 07/04/2018 9:40 AM Performed by: Ronnette Hila, CRNA Pre-anesthesia Checklist: Patient identified, Emergency Drugs available, Suction available and Patient being monitored Patient Re-evaluated:Patient Re-evaluated prior to induction Oxygen Delivery Method: Circle system utilized Preoxygenation: Pre-oxygenation with 100% oxygen Induction Type: IV induction Ventilation: Mask ventilation without difficulty LMA: LMA inserted LMA Size: 4.0 Number of attempts: 1 Airway Equipment and Method: Bite block Placement Confirmation: positive ETCO2 Tube secured with: Tape Dental Injury: Teeth and Oropharynx as per pre-operative assessment

## 2018-07-04 NOTE — H&P (Signed)
Cheyenne Mccoy is an 52 y.o. female.   Chief Complaint: numbness left index finger SEG:BTDVVO is a 52 year old right-hand-dominant female referred by Dr. Darcus Austin for consultation regarding numbness in her left index finger. She sustained a laceration approximately 3 weeks ago cooking on the radial aspect of the proximal phalanx she complained of an initial numbness and tingling. To the radial side of the index finger. She has no prior history of injury. She did not seek medical attention for this. 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 She has tenderness at the scar with radiation of pain distally if she hits the area. This is a marked moderate to severe in nature. He has a history of diabetes thyroid problems no history of arthritis or gout. Family history is negative for diabetes thyroid problems but positive for for arthritis and gout. She was seen by her titrates to recommended that she be seen by her family practitioner and subsequently was referred for examination. She is not complaining of any loss of mobility.    Past Medical History:  Diagnosis Date  . ADD (attention deficit disorder)   . ADHD (attention deficit hyperactivity disorder)   . Anxiety   . Bipolar disorder ([place])   . Depression    bipolar   . Diabetes mellitus without complication ([place])   . Hyperlipidemia   . Hypertension   . Hypothyroidism   . Insomnia   . Laceration of index finger    left  . Tuberculosis    history positive ppd's - chest xray neg     Past Surgical History:  Procedure Laterality Date  . BREAST SURGERY     left side - cyst removed benighn  . CERVICAL POLYPECTOMY N/A 08/19/2013   Procedure: endometrial POLYPECTOMY;  Surgeon: Annalee Genta, DO;  Location: [facility];  Service: Gynecology;  Laterality: N/A;  . DILATATION & CURETTAGE/HYSTEROSCOPY WITH TRUECLEAR N/A 08/19/2013   Procedure: DILATATION & CURETTAGE/HYSTEROSCOPY WITH TRUCLEAR;  Surgeon: Annalee Genta, DO;  Location: [facility];  Service:  Gynecology;  Laterality: N/A;  . DILATION AND CURETTAGE OF UTERUS    . DILITATION & CURRETTAGE/HYSTROSCOPY WITH NOVASURE ABLATION N/A 08/19/2013   Procedure: DILATATION & CURETTAGE/HYSTEROSCOPY WITH NOVASURE ABLATION;  Surgeon: Annalee Genta, DO;  Location: [facility];  Service: Gynecology;  Laterality: N/A;  . POLYPECTOMY    . WISDOM TOOTH EXTRACTION      Family History  Problem Relation Age of Onset  . Depression Mother   . Drug abuse Brother   . Depression Maternal Uncle   . Depression Maternal Grandmother    Social History:  reports that she quit smoking about 5 years ago. Her smoking use included cigarettes. She has a 10.00 pack-year smoking history. She has never used smokeless tobacco. She reports that she drinks alcohol. She reports that she does not use drugs.  Allergies:  Allergies  Allergen Reactions  . Bee Venom Anaphylaxis  . Codeine Anaphylaxis and Hives  . Penicillins Anaphylaxis and Hives    No medications prior to admission.    Results for orders placed or performed during the hospital encounter of 07/04/18 (from the past 48 hour(s))  Basic metabolic panel     Status: Abnormal   Collection Time: 07/03/18  4:36 PM  Result Value Ref Range   Sodium 136 135 - 145 mmol/L   Potassium 4.4 3.5 - 5.1 mmol/L   Chloride 103 98 - 111 mmol/L   CO2 27 22 - 32 mmol/L   Glucose, Bld 91 70 -  99 mg/dL   BUN 15 6 - 20 mg/dL   Creatinine, Ser 0.77 0.44 - 1.00 mg/dL   Calcium 11.1 (H) 8.9 - 10.3 mg/dL   GFR calc non Af Amer >60 >60 mL/min   GFR calc Af Amer >60 >60 mL/min    Comment: (NOTE) The eGFR has been calculated using the CKD EPI equation. This calculation has not been validated in all clinical situations. eGFR's persistently <60 mL/min signify possible Chronic Kidney Disease.    Anion gap 6 5 - 15    Comment: Performed at [facility] [address], [place], [place] [address]    No results found.   Pertinent items are noted in HPI.  Height _0   (1.676 m), weight 117 kg.  General appearance: alert, cooperative and appears stated age Head: Normocephalic, without obvious abnormality Neck: no JVD Resp: clear to auscultation bilaterally Cardio: regular rate and rhythm, S1, S2 normal, no murmur, click, rub or gallop GI: soft, non-tender; bowel sounds normal; no masses,  no organomegaly Extremities: numbness index finger left hand Pulses: 2+ and symmetric Skin: Skin color, texture, turgor normal. No rashes or lesions Neurologic: Grossly normal Incision/Wound: na  Assessment/Plan Assessment:   Laceration of left index finger without foreign body with damage to nail  Laceration of digital nerve of finger   Plan: We have discussed exploration of the nerve with her. She would like to have this done. This will be scheduled as an outpatient left index finger possible repair or neural tube left index finger possible arterial repair that she is aware there is no guarantee to the surgery the possibility of infection recurrence injury to arteries nerves tendons complete relief of symptoms. She is advised of the the ability of incomplete return of sensation and she is scheduled for exploration repair arteries nervesleft index finger as an outpatient under regional anesthesia. She is advised this may require a neural tube.   [name] 07/04/2018, 5:41 AM

## 2018-07-04 NOTE — Anesthesia Procedure Notes (Signed)
Anesthesia Regional Block: Axillary brachial plexus block   Pre-Anesthetic Checklist: ,, timeout performed, Correct Patient, Correct Site, Correct Laterality, Correct Procedure, Correct Position, site marked, Risks and benefits discussed,  Surgical consent,  Pre-op evaluation,  At surgeon's request and post-op pain management  Laterality: Left  Prep: chloraprep       Needles:  Injection technique: Single-shot  Needle Type: Echogenic Stimulator Needle     Needle Length: 5cm  Needle Gauge: 22     Additional Needles:   Procedures:, nerve stimulator,,, ultrasound used (permanent image in chart),,,,  Narrative:  Start time: 07/04/2018 9:21 AM End time: 07/04/2018 9:31 AM Injection made incrementally with aspirations every 5 mL.  Performed by: Personally  Anesthesiologist: Damione Robideau, MD  Additional Notes: Functioning IV was confirmed and monitors were applied.  A 24mmLKentuckyy7.82GMerit Health BiCentral Desert Behavioral Health Services Of [facility] Mex40.Memorial Amb[facility] SurgeLynnaeLars [id number] Memorial H(8[facility] H40.[place] HoLynnaeLars MageJa47m62LKentuckyy7.82GCollege Medical Cen340.[facility] MLynnaeLars MageJa89m62LKentuckyy7.82GPeacehealth St John Medical [facility] Cam8[facility]40.[place] Va MLynnaeLars MageJa21m62LKentuckyy7.82GTruckee Surgery Center (51[place] H40.St Vincent ClayLynnaeLars MageJa75m62LKentuckyy7.82GP & S Surgical Hospi3[facility] H40.[facility] OfLynnaeLars MageJa70m62LKentuckyy7.82GSt. Albans Community Living Cen(7[facility] H40.[facility] CaLynnaeLars MageJa85m62LKentuckyy7.82GLincolnhealth - Miles Cam4Alliance Surgical Cen40.[facility] LynnaeLars MageJa26m62LKentuckyy7.82GDesert Cliffs Surgery Center 9[place] H40.[facility] Outpatient SurgicLynnaeLars MageJa46m62LKentuckyy7.82GNash General Hospi(71[facility] Va Medical40.Capital Health Medical CentLynnaeLars MageJa92m62LKentuckyy7.82GClear Lake SurgicareMethodist Texsan H40.[facility] WexfoLynnaeLars MageJa51m62LKentuckyy7.82GNorth Bay Vacavalley Hospi6[facility] Post-[facility]ute Ca40.Gi Wellness CenterLynnaeLars MageJa34m62LKentuckyy7.82GLighthouse Care Center Of Augu(4[place] Hea40.PLynnaeLars MageJa49m62LKentuckyy7.82GGarfield Memorial Hospi(8Cec Surgical Servi40.Degraff MemoLynnaeLars MageJa9m62LKentuckyy7.82GOhsu Transplant Hospi(40[facility] New 40.[place] RehabilitaLynnaeLars MageJa82m62LKentuckyy7.82GSan Francisco Endoscopy Center 6[facility] S40.Memorial MLynnaeLars MageJa32m62LKentuckyy7.82GMidatlantic Gastronintestinal Center (7[facility] 40.[facility] SLynnaeLars MageJa64m62LKentuckyy7.82GDickinson County Memorial Hospi8[facility] Endosurgical Cen40.[facility] DLynnaeLars MageJa99m62LKentuckyy7.82GFrederick Surgical Cen(3[facility] Medical40.Bacon CoLynnaeLars MageJa65m62LKentuckyy7.82GRiver Rd Surgery Cen(80[place] H40.San DiegLynnaeLars MageJa49m62LKentuckyy7.82GUs Air Force Hospital-Tuc3Va Medical Center - Cana40.Over[facility] Brooks Va MLynnaeLars MageJa20m62LKentuckyy7.82GWest Valley Hospi(6[facility] Ce40.Memorial Hermann SurgerLynnaeLars MageJa73m62LKentuckyy7.82GTaylorville Memorial Hospi2[facility] Ce40.[facility] LynnaeLars MageJa74m62LKentuckyy7.82GPatton State Hospi(6[facility] H40.James P TLynnaeLars MageJa45m62LKentuckyy7.82GEndoscopy Center Of Day3[facility]gery [facility]40.[facility]it LynnaeLars MageJa30m62LKentuckyy7.82GThe Bariatric Center Of [place], 9[facility]40.Wayne CoLynnaeLars MageJa42m62LKentuckyy7.82GUs Phs Winslow Indian Hospi7[facility] H40.[facility] Hospital SouthweLynnaeLars MageJa21m62LKentuckyy7.82GRegional Hospital For Respiratory & Complex C(83Charlotte Hungerford H40.[facility] LynnaeLars MageJa9629uary78veportHavennic stimulator needle was used. Sterile prep and drape,hand hygiene and sterile gloves were used. Ultrasound guidance: relevant anatomy identified, needle position confirmed, local anesthetic spread visualized around nerve(s)., vascular puncture avoided.  Image printed for medical record. Negative aspiration and negative test dose prior to incremental administration of local anesthetic. The patient tolerated the procedure well.

## 2018-07-04 NOTE — Op Note (Signed)
NAME: Cheyenne Mccoy MEDICAL RECORD NO: [id number] DATE OF BIRTH: 1966/02/15 FACILITY: [facility] LOCATION: [facility] PHYSICIAN: Nicki Reaper, MD   OPERATIVE REPORT   DATE OF PROCEDURE: 07/04/18    PREOPERATIVE DIAGNOSIS:   Laceration left index finger digital nerve laceration   POSTOPERATIVE DIAGNOSIS:   Same   PROCEDURE:   Expiration digital artery with coagulation of branches and repair of digital nerve radial aspect using the operative microscope with a neurosurgeon wrap   SURGEON: Cindee Salt, M.D.   ASSISTANT: Betha Loa, MD   ANESTHESIA:  Regional with sedation   INTRAVENOUS FLUIDS:  Per anesthesia flow sheet.   ESTIMATED BLOOD LOSS:  Minimal.   COMPLICATIONS:  None.   SPECIMENS:  none   TOURNIQUET TIME:    Total Tourniquet Time Documented: Upper Arm (Left) - 38 minutes Total: Upper Arm (Left) - 38 minutes    DISPOSITION:  Stable to PACU.   INDICATIONS: Patient is a 52 year old female who sustained a laceration to the radial palmar aspect of her left index finger approximately 4 weeks ago.  She complained of instant numbness and tingling which has persisted.  Originally seen elsewhere and referred with continued numbness and tingling to the radial aspect of her index finger.  She is like to undergo surgical exploration repair as dictated by findings arteries nerves tendons.  Pre-peri-and postoperative course been discussed along with risk complications.  She is aware that there is no guarantee to the surgery the possibility of infection recurrence injury to arteries nerves tendons complete relief symptoms and dystrophy.  In the preoperative area the patient is seen extremity marked by both patient and surgeon antibiotic given  OPERATIVE COURSE: Patient brought to the operating room after supraclavicular block was carried out without difficulty in the preoperative area under the direction the anesthesia department.  She was prepped and draped in  supine position with left arm free.  Prep was done with ChloraPrep and a three-minute dry time allowed timeout taken to confirm patient procedure.  The limb was exsanguinated with an Esmarch bandage turn placed on the arm was inflated to 250 mmHg.  The incision was incorporated to a volar Bruner incision.  This carried down through subcutaneous tissue.  Bleeders were electrocauterized necessary with bipolar.  The digital artery was found noted to be densely scarred along with the digital nerve.  The operating microscope was brought into the field.  The artery was freed of all scar tissue.  Branches which had been cut were cauterized.  A was done with bipolar.  The digital nerve was densely scarred.  This was found to have a zigzag deformity to it.  This area was then transected revealing dense scar this was cut back proximally and distally until fascicles were noted under the operative microscope.  Wound was irrigated.  The flexor tendon was found to be unviolated.  The digital nerve was then sutured with interrupted 9-0 nylon sutures with epineural repair.  A exigent wrap was then wrapped around this and sutured into position with interrupted 9-0 nylon sutures.  The wound was again irrigated with saline.  The skin was then closed interrupted 4-0 nylon sutures.  A sterile compressive dressing and dorsal splint applied.  Minimal tension to the repair site was relieved with slight flexion of the index finger at the metacarpal phalangeal joint.  Patient tolerated the procedure well and deflation of the tourniquet all fingers immediately pink.  She was taken to the recovery room for observation in satisfactory  condition.  She will be discharged home to return Tonny Bollman agrees were in 1 week on Ultram.   Cindee Salt, MD Electronically signed, 07/04/18

## 2018-07-04 NOTE — Op Note (Signed)
I assisted Surgeon(s) and Role:    * Cindee Salt, MD - Primary    * Betha Loa, MD - Assisting on the Procedure(s): WOUND EXPLORATION WITH REPAIR LEFT INDEX NERVE, AXOGUARD TUBE on 07/04/2018.  I provided assistance on this case as follows: retraction soft tissues, dissection, identification of structures, and repair assistance under microscope.  Electronically signed by: Betha Loa, MD Date: 07/04/2018 Time: 10:40 AM

## 2018-07-04 NOTE — Discharge Instructions (Addendum)
[facility] Instructions °Hand Surgery ° °Wound Care: °Keep your hand elevated above the level of your heart.  Do not allow it to dangle by your side.  Keep the dressing dry and do not remove it unless your doctor advises you to do so.  He will usually change it at the time of your post-op visit.  Moving your fingers is advised to stimulate circulation but will depend on the site of your surgery.  If you have a splint applied, your doctor will advise you regarding movement. ° °Activity: °Do not drive or operate machinery today.  Rest today and then you may return to your normal activity and work as indicated by your physician. ° °Diet:  °Drink liquids today or eat a light diet.  You may resume a regular diet tomorrow.   ° °General expectations: °Pain for two to three days. °Fingers may become slightly swollen. ° °Call your doctor if any of the following occur: °Severe pain not relieved by pain medication. °Elevated temperature. °Dressing soaked with blood. °Inability to move fingers. °White or bluish color to fingers. ° ° ° ° °Post Anesthesia Home Care Instructions ° °Activity: °Get plenty of rest for the remainder of the day. A responsible individual must stay with you for 24 hours following the procedure.  °For the next 24 hours, DO NOT: °-Drive a car °-Operate machinery °-Drink alcoholic beverages °-Take any medication unless instructed by your physician °-Make any legal decisions or sign important papers. ° °Meals: °Start with liquid foods such as gelatin or soup. Progress to regular foods as tolerated. Avoid greasy, spicy, heavy foods. If nausea and/or vomiting occur, drink only clear liquids until the nausea and/or vomiting subsides. Call your physician if vomiting continues. ° °Special Instructions/Symptoms: °Your throat may feel dry or sore from the anesthesia or the breathing tube placed in your throat during surgery. If this causes discomfort, gargle with warm salt water. The discomfort should disappear  within 24 hours. ° °If you had a scopolamine patch placed behind your ear for the management of post- operative nausea and/or vomiting: ° °1. The medication in the patch is effective for 72 hours, after which it should be removed.  Wrap patch in a tissue and discard in the trash. Wash hands thoroughly with soap and water. °2. You may remove the patch earlier than 72 hours if you experience unpleasant side effects which may include dry mouth, dizziness or visual disturbances. °3. Avoid touching the patch. Wash your hands with soap and water after contact with the patch. °   °Regional Anesthesia Blocks ° °1. Numbness or the inability to move the "blocked" extremity may last from 3-48 hours after placement. The length of time depends on the medication injected and your individual response to the medication. If the numbness is not going away after 48 hours, call your surgeon. ° °2. The extremity that is blocked will need to be protected until the numbness is gone and the  Strength has returned. Because you cannot feel it, you will need to take extra care to avoid injury. Because it may be weak, you may have difficulty moving it or using it. You may not know what position it is in without looking at it while the block is in effect. ° °3. For blocks in the legs and feet, returning to weight bearing and walking needs to be done carefully. You will need to wait until the numbness is entirely gone and the strength has returned. You should be able to move   your leg and foot normally before you try and bear weight or walk. You will need someone to be with you when you first try to ensure you do not fall and possibly risk injury. ° °4. Bruising and tenderness at the needle site are common side effects and will resolve in a few days. ° °5. Persistent numbness or new problems with movement should be communicated to the surgeon or the [facility] ([phone])/ [facility] ([phone]).Call your surgeon  if you experience:  ° °1.  Fever over 101.0. °2.  Inability to urinate. °3.  Nausea and/or vomiting. °4.  Extreme swelling or bruising at the surgical site. °5.  Continued bleeding from the incision. °6.  Increased pain, redness or drainage from the incision. °7.  Problems related to your pain medication. °8.  Any problems and/or concerns °

## 2018-07-04 NOTE — Anesthesia Postprocedure Evaluation (Signed)
Anesthesia Post Note  Patient: Aairah R Monteleone  Procedure(s) Performed: WOUND EXPLORATION WITH REPAIR LEFT INDEX NERVE, AXOGUARD TUBE (Left Finger)     Patient location during evaluation: PACU Anesthesia Type: General Level of consciousness: awake and alert Pain management: pain level controlled Vital Signs Assessment: post-procedure vital signs reviewed and stable Respiratory status: spontaneous breathing, nonlabored ventilation, respiratory function stable and patient connected to nasal cannula oxygen Cardiovascular status: blood pressure returned to baseline and stable Postop Assessment: no apparent nausea or vomiting Anesthetic complications: no    Last Vitals:  Vitals:   07/04/18 1107 07/04/18 1137  BP:  113/81  Pulse: 84 79  Resp: (!) 22 16  Temp:  36.5 C  SpO2: 95% 93%    Last Pain:  Vitals:   07/04/18 1137  TempSrc: Oral  PainSc: 0-No pain                 Yanitza Shvartsman

## 2018-07-04 NOTE — Anesthesia Preprocedure Evaluation (Signed)
Anesthesia Evaluation  Patient identified by MRN, date of birth, ID band Patient awake    Reviewed: Allergy & Precautions, H&P , NPO status , Patient's Chart, lab work & pertinent test results  Airway Mallampati: II  TM Distance: >3 FB Neck ROM: full    Dental no notable dental hx. (+) Teeth Intact   Pulmonary former smoker,    Pulmonary exam normal        Cardiovascular hypertension, Pt. on medications Normal cardiovascular exam     Neuro/Psych negative neurological ROS     GI/Hepatic negative GI ROS, Neg liver ROS,   Endo/Other  diabetes, Type 2, Oral Hypoglycemic AgentsMorbid obesity  Renal/GU negative Renal ROS     Musculoskeletal negative musculoskeletal ROS (+)   Abdominal (+) + obese,   Peds  Hematology negative hematology ROS (+)   Anesthesia Other Findings   Reproductive/Obstetrics negative OB ROS                            Anesthesia Physical  Anesthesia Plan  ASA: III  Anesthesia Plan: General   Post-op Pain Management:    Induction: Intravenous  PONV Risk Score and Plan: 2 and Ondansetron, Dexamethasone and Treatment may vary due to age or medical condition  Airway Management Planned: LMA  Additional Equipment:   Intra-op Plan:   Post-operative Plan: Extubation in OR  Informed Consent: I have reviewed the patients History and Physical, chart, labs and discussed the procedure including the risks, benefits and alternatives for the proposed anesthesia with the patient or authorized representative who has indicated his/her understanding and acceptance.     Plan Discussed with: CRNA and Anesthesiologist  Anesthesia Plan Comments: ( )        Anesthesia Quick Evaluation

## 2018-07-04 NOTE — Progress Notes (Signed)
Assisted Dr. Oddono with left, ultrasound guided, axillary block. Side rails up, monitors on throughout procedure. See vital signs in flow sheet. Tolerated Procedure well. 

## 2018-07-04 NOTE — Brief Op Note (Signed)
07/04/2018  10:36 AM  PATIENT:  Cheyenne Mccoy  52 y.o. female  PRE-OPERATIVE DIAGNOSIS:  LACERATION LEFT INDEX FINGER  POST-OPERATIVE DIAGNOSIS:  LACERATION LEFT INDEX FINGER  PROCEDURE:  Procedure(s): WOUND EXPLORATION WITH REPAIR LEFT INDEX NERVE, AXOGUARD TUBE (Left)  SURGEON:  Surgeon(s) and Role:    * Cindee Salt, MD - Primary    * Betha Loa, MD - Assisting  PHYSICIAN ASSISTANT:   ASSISTANTS: K Arianis Bowditch,MD   ANESTHESIA:   regional and IV sedation  EBL:  5 mL   BLOOD ADMINISTERED:none  DRAINS: none   LOCAL MEDICATIONS USED:  NONE  SPECIMEN:  No Specimen  DISPOSITION OF SPECIMEN:  N/A  COUNTS:  YES  TOURNIQUET:   Total Tourniquet Time Documented: Upper Arm (Left) - 38 minutes Total: Upper Arm (Left) - 38 minutes   DICTATION: .[name] Dictation  PLAN OF CARE: Discharge to home after PACU  PATIENT DISPOSITION:  PACU - hemodynamically stable.

## 2018-07-04 NOTE — Transfer of Care (Signed)
Immediate Anesthesia Transfer of Care Note  Patient: Cheyenne Mccoy  Procedure(s) Performed: WOUND EXPLORATION WITH REPAIR LEFT INDEX NERVE, AXOGUARD TUBE (Left Finger)  Patient Location: PACU  Anesthesia Type:GA combined with regional for post-op pain  Level of Consciousness: awake and drowsy  Airway & Oxygen Therapy: Patient Spontanous Breathing and Patient connected to face mask oxygen  Post-op Assessment: Report given to RN and Post -op Vital signs reviewed and stable  Post vital signs: Reviewed and stable  Last Vitals:  Vitals Value Taken Time  BP    Temp    Pulse 89 07/04/2018 10:42 AM  Resp 35 07/04/2018 10:42 AM  SpO2 97 % 07/04/2018 10:42 AM  Vitals shown include unvalidated device data.  Last Pain:  Vitals:   07/04/18 0915  TempSrc:   PainSc: 0-No pain      Patients Stated Pain Goal: 3 (07/04/18 0915)  Complications: No apparent anesthesia complications

## 2018-07-09 ENCOUNTER — Encounter (HOSPITAL_BASED_OUTPATIENT_CLINIC_OR_DEPARTMENT_OTHER): Payer: Self-pay | Admitting: Orthopedic Surgery

## 2018-07-12 ENCOUNTER — Encounter (HOSPITAL_BASED_OUTPATIENT_CLINIC_OR_DEPARTMENT_OTHER): Payer: Self-pay | Admitting: Orthopedic Surgery

## 2018-08-14 ENCOUNTER — Other Ambulatory Visit: Payer: Self-pay | Admitting: Family Medicine

## 2018-08-14 DIAGNOSIS — Z1231 Encounter for screening mammogram for malignant neoplasm of breast: Secondary | ICD-10-CM

## 2018-08-20 ENCOUNTER — Other Ambulatory Visit: Payer: Self-pay | Admitting: Endocrinology

## 2018-08-20 DIAGNOSIS — E210 Primary hyperparathyroidism: Secondary | ICD-10-CM

## 2018-09-24 DIAGNOSIS — Z13820 Encounter for screening for osteoporosis: Secondary | ICD-10-CM | POA: Diagnosis not present

## 2018-09-24 DIAGNOSIS — E559 Vitamin D deficiency, unspecified: Secondary | ICD-10-CM | POA: Diagnosis not present

## 2018-09-25 ENCOUNTER — Ambulatory Visit
Admission: RE | Admit: 2018-09-25 | Discharge: 2018-09-25 | Disposition: A | Discharge status: Home / Self Care | Payer: Medicare Other | Source: Ambulatory Visit | Attending: Family Medicine | Admitting: Family Medicine

## 2018-09-25 DIAGNOSIS — Z1231 Encounter for screening mammogram for malignant neoplasm of breast: Secondary | ICD-10-CM | POA: Diagnosis not present

## 2018-10-07 ENCOUNTER — Other Ambulatory Visit: Payer: Self-pay | Admitting: Endocrinology

## 2018-10-07 ENCOUNTER — Other Ambulatory Visit (HOSPITAL_COMMUNITY): Payer: Self-pay | Admitting: Endocrinology

## 2018-10-07 DIAGNOSIS — E559 Vitamin D deficiency, unspecified: Secondary | ICD-10-CM | POA: Diagnosis not present

## 2018-10-07 DIAGNOSIS — E210 Primary hyperparathyroidism: Secondary | ICD-10-CM | POA: Diagnosis not present

## 2018-10-09 ENCOUNTER — Encounter (HOSPITAL_COMMUNITY)
Admission: RE | Admit: 2018-10-09 | Discharge: 2018-10-09 | Disposition: A | Discharge status: Home / Self Care | Payer: Medicare Other | Source: Ambulatory Visit | Attending: Endocrinology | Admitting: Endocrinology

## 2018-10-09 ENCOUNTER — Ambulatory Visit (HOSPITAL_COMMUNITY)
Admission: RE | Admit: 2018-10-09 | Discharge: 2018-10-09 | Disposition: A | Discharge status: Home / Self Care | Payer: Medicare Other | Source: Ambulatory Visit | Attending: Endocrinology | Admitting: Endocrinology

## 2018-10-09 DIAGNOSIS — E210 Primary hyperparathyroidism: Secondary | ICD-10-CM | POA: Diagnosis not present

## 2018-10-09 MED ORDER — TECHNETIUM TC 99M SESTAMIBI - CARDIOLITE
25.6000 | Freq: Once | INTRAVENOUS | Status: AC | PRN
  Administered 2018-10-09: 25.6 via INTRAVENOUS

## 2018-11-06 DIAGNOSIS — E210 Primary hyperparathyroidism: Secondary | ICD-10-CM | POA: Diagnosis not present

## 2018-11-08 ENCOUNTER — Other Ambulatory Visit: Payer: Self-pay | Admitting: Surgery

## 2018-11-08 DIAGNOSIS — E210 Primary hyperparathyroidism: Secondary | ICD-10-CM

## 2018-11-15 ENCOUNTER — Ambulatory Visit
Admission: RE | Admit: 2018-11-15 | Discharge: 2018-11-15 | Disposition: A | Discharge status: Home / Self Care | Payer: Medicare Other | Source: Ambulatory Visit | Attending: Surgery | Admitting: Surgery

## 2018-11-15 DIAGNOSIS — E210 Primary hyperparathyroidism: Secondary | ICD-10-CM | POA: Diagnosis not present

## 2018-12-04 ENCOUNTER — Other Ambulatory Visit: Payer: Self-pay | Admitting: Surgery

## 2018-12-04 DIAGNOSIS — E210 Primary hyperparathyroidism: Secondary | ICD-10-CM

## 2019-01-28 ENCOUNTER — Other Ambulatory Visit: Payer: Medicare Other

## 2019-02-10 ENCOUNTER — Ambulatory Visit
Admission: RE | Admit: 2019-02-10 | Discharge: 2019-02-10 | Disposition: A | Discharge status: Home / Self Care | Payer: Medicare Other | Source: Ambulatory Visit | Attending: Surgery | Admitting: Surgery

## 2019-02-10 ENCOUNTER — Other Ambulatory Visit: Payer: Self-pay

## 2019-02-10 DIAGNOSIS — E210 Primary hyperparathyroidism: Secondary | ICD-10-CM

## 2019-02-10 MED ORDER — IOPAMIDOL (ISOVUE-300) INJECTION 61%
75.0000 mL | Freq: Once | INTRAVENOUS | Status: AC | PRN
  Administered 2019-02-10: 75 mL via INTRAVENOUS

## 2019-02-17 DIAGNOSIS — E039 Hypothyroidism, unspecified: Secondary | ICD-10-CM | POA: Diagnosis not present

## 2019-02-17 DIAGNOSIS — I10 Essential (primary) hypertension: Secondary | ICD-10-CM | POA: Diagnosis not present

## 2019-02-17 DIAGNOSIS — F419 Anxiety disorder, unspecified: Secondary | ICD-10-CM | POA: Diagnosis not present

## 2019-02-17 DIAGNOSIS — E1165 Type 2 diabetes mellitus with hyperglycemia: Secondary | ICD-10-CM | POA: Diagnosis not present

## 2019-02-18 DIAGNOSIS — E1165 Type 2 diabetes mellitus with hyperglycemia: Secondary | ICD-10-CM | POA: Diagnosis not present

## 2019-02-18 DIAGNOSIS — E039 Hypothyroidism, unspecified: Secondary | ICD-10-CM | POA: Diagnosis not present

## 2019-02-18 DIAGNOSIS — I10 Essential (primary) hypertension: Secondary | ICD-10-CM | POA: Diagnosis not present

## 2019-02-18 DIAGNOSIS — E538 Deficiency of other specified B group vitamins: Secondary | ICD-10-CM | POA: Diagnosis not present

## 2019-02-26 DIAGNOSIS — S6440XD Injury of digital nerve of unspecified finger, subsequent encounter: Secondary | ICD-10-CM | POA: Diagnosis not present

## 2019-03-26 DIAGNOSIS — E210 Primary hyperparathyroidism: Secondary | ICD-10-CM | POA: Diagnosis not present

## 2019-03-31 ENCOUNTER — Ambulatory Visit: Payer: Self-pay | Admitting: Surgery

## 2019-03-31 DIAGNOSIS — E210 Primary hyperparathyroidism: Secondary | ICD-10-CM | POA: Diagnosis not present

## 2019-05-07 NOTE — Patient Instructions (Addendum)
DUE TO COVID-19 ONLY ONE VISITOR IS ALLOWED TO COME WITH YOU AND STAY IN THE WAITING ROOM ONLY DURING PRE OP AND PROCEDURE. THE ONE VISITOR MAY VISIT WITH YOU IN YOUR PRIVATE ROOM DURING VISITING HOURS ONLY!!   COVID SWAB TESTING COMPLETED ON: Thursday, Aug. 27, 2020. (Must self quarantine after testing. Follow instructions on handout.)             Your procedure is scheduled on: Monday, Aug. 31, 2020   Report to [facility] Main  Entrance   Report to Short Stay at 5:30 AM   Call this number if you have problems the morning of surgery [phone]   Do not eat food or drink liquids :After Midnight.   Brush your teeth the morning of surgery.   Do NOT smoke after Midnight   Take these medicines the morning of surgery with A SIP OF WATER: Levothyroxine, Alprazolam if needed  DO NOT TAKE ANY DIABETIC MEDICATIONS DAY OF YOUR SURGERY                               You may not have any metal on your body including hair pins, jewelry, and body piercings             Do not wear make-up, lotions, powders, perfumes/cologne, or deodorant             Do not wear nail polish.  Do not shave  48 hours prior to surgery.                Do not bring valuables to the hospital. [place].   Contacts, dentures or bridgework may not be worn into surgery.   Bring small overnight bag day of surgery.    Special Instructions: Bring a copy of your healthcare power of attorney and living will documents         the day of surgery if you haven't scanned them in before.              Please read over the following fact sheets you were given:  [facility] - Preparing for Surgery Before surgery, you can play an important role.  Because skin is not sterile, your skin needs to be as free of germs as possible.  You can reduce the number of germs on your skin by washing with CHG (chlorahexidine gluconate) soap before surgery.  CHG is an antiseptic cleaner  which kills germs and bonds with the skin to continue killing germs even after washing. Please DO NOT use if you have an allergy to CHG or antibacterial soaps.  If your skin becomes reddened/irritated stop using the CHG and inform your nurse when you arrive at Short Stay. Do not shave (including legs and underarms) for at least 48 hours prior to the first CHG shower.  You may shave your face/neck.  Please follow these instructions carefully:  1.  Shower with CHG Soap the night before surgery and the  morning of surgery.  2.  If you choose to wash your hair, wash your hair first as usual with your normal  shampoo.  3.  After you shampoo, rinse your hair and body thoroughly to remove the shampoo.  4.  Use CHG as you would any other liquid soap.  You can apply chg directly to the skin and wash.  Gently with a scrungie or clean washcloth.  5.  Apply the CHG Soap to your body ONLY FROM THE NECK DOWN.   Do   not use on face/ open                           Wound or open sores. Avoid contact with eyes, ears mouth and   genitals (private parts).                       Wash face,  Genitals (private parts) with your normal soap.             6.  Wash thoroughly, paying special attention to the area where your    surgery  will be performed.  7.  Thoroughly rinse your body with warm water from the neck down.  8.  DO NOT shower/wash with your normal soap after using and rinsing off the CHG Soap.                9.  Pat yourself dry with a clean towel.            10.  Wear clean pajamas.            11.  Place clean sheets on your bed the night of your first shower and do not  sleep with pets. Day of Surgery : Do not apply any lotions/deodorants the morning of surgery.  Please wear clean clothes to the hospital/surgery center.  FAILURE TO FOLLOW THESE INSTRUCTIONS MAY RESULT IN THE CANCELLATION OF YOUR SURGERY  PATIENT SIGNATURE_________________________________  NURSE  SIGNATURE__________________________________  ________________________________________________________________________

## 2019-05-08 ENCOUNTER — Encounter (HOSPITAL_COMMUNITY): Payer: Self-pay

## 2019-05-08 ENCOUNTER — Encounter (HOSPITAL_COMMUNITY)
Admission: RE | Admit: 2019-05-08 | Discharge: 2019-05-08 | Disposition: A | Discharge status: Home / Self Care | Payer: Medicare Other | Source: Ambulatory Visit | Attending: Surgery | Admitting: Surgery

## 2019-05-08 ENCOUNTER — Other Ambulatory Visit: Payer: Self-pay

## 2019-05-08 ENCOUNTER — Other Ambulatory Visit (HOSPITAL_COMMUNITY)
Admission: RE | Admit: 2019-05-08 | Discharge: 2019-05-08 | Disposition: A | Discharge status: Home / Self Care | Payer: Medicare Other | Source: Ambulatory Visit | Attending: Surgery | Admitting: Surgery

## 2019-05-08 DIAGNOSIS — Z20828 Contact with and (suspected) exposure to other viral communicable diseases: Secondary | ICD-10-CM | POA: Diagnosis not present

## 2019-05-08 DIAGNOSIS — Z01812 Encounter for preprocedural laboratory examination: Secondary | ICD-10-CM | POA: Diagnosis not present

## 2019-05-08 DIAGNOSIS — E210 Primary hyperparathyroidism: Secondary | ICD-10-CM | POA: Insufficient documentation

## 2019-05-08 HISTORY — DX: Gastro-esophageal reflux disease without esophagitis: K21.9

## 2019-05-08 LAB — HEMOGLOBIN A1C
Hgb A1c MFr Bld: 5.9 % — ABNORMAL HIGH (ref 4.8–5.6)
Mean Plasma Glucose: 122.63 mg/dL

## 2019-05-08 LAB — BASIC METABOLIC PANEL
Anion gap: 9 (ref 5–15)
BUN: 18 mg/dL (ref 6–20)
CO2: 23 mmol/L (ref 22–32)
Calcium: 11.5 mg/dL — ABNORMAL HIGH (ref 8.9–10.3)
Chloride: 107 mmol/L (ref 98–111)
Creatinine, Ser: 0.81 mg/dL (ref 0.44–1.00)
GFR calc Af Amer: >60 mL/min (ref >60)
GFR calc non Af Amer: >60 mL/min (ref >60)
Glucose, Bld: 149 mg/dL — ABNORMAL HIGH (ref 70–99)
Potassium: 4.5 mmol/L (ref 3.5–5.1)
Sodium: 139 mmol/L (ref 135–145)

## 2019-05-08 LAB — CBC
HCT: 39.3 % (ref 36.0–46.0)
Hemoglobin: 12.2 g/dL (ref 12.0–15.0)
MCH: 28.2 pg (ref 26.0–34.0)
MCHC: 31 g/dL (ref 30.0–36.0)
MCV: 90.8 fL (ref 80.0–100.0)
Platelets: 228 10*3/uL (ref 150–400)
RBC: 4.33 MIL/uL (ref 3.87–5.11)
RDW: 12.9 % (ref 11.5–15.5)
WBC: 6.2 10*3/uL (ref 4.0–10.5)
nRBC: 0 % (ref 0.0–0.2)

## 2019-05-08 LAB — GLUCOSE, CAPILLARY: Glucose-Capillary: 146 mg/dL — ABNORMAL HIGH (ref 70–99)

## 2019-05-08 LAB — SARS CORONAVIRUS 2 (TAT 6-24 HRS): SARS Coronavirus 2: NEGATIVE

## 2019-05-08 NOTE — Progress Notes (Signed)
SPOKE W/  Orena     SCREENING SYMPTOMS OF COVID 19:   COUGH--NO  RUNNY NOSE--- NO  SORE THROAT---NO  NASAL CONGESTION----NO  SNEEZING----NO  SHORTNESS OF BREATH---NO  DIFFICULTY BREATHING---NO  TEMP >100.0 -----NO  UNEXPLAINED BODY ACHES------NO  CHILLS -------- NO  HEADACHES ---------NO  LOSS OF SMELL/ TASTE --------NO    HAVE YOU OR ANY FAMILY MEMBER TRAVELLED PAST 14 DAYS OUT OF THE   COUNTY---Lives in [place]----NO COUNTRY----NO HAVE YOU OR ANY FAMILY MEMBER BEEN EXPOSED TO ANYONE WITH COVID 19? NO

## 2019-05-08 NOTE — Progress Notes (Signed)
PCP - [facility] Cardiologist - N/A  Chest x-ray - N/A EKG - 06/2018 in epic Stress Test - N/A ECHO - N/A Cardiac Cath - N/A  Sleep Study - N/A CPAP - N/A  Fasting Blood Sugar - Patient does not check blood sugars, last fasting glucose at MD's office 109 in 02/2019 Checks Blood Sugar  Zero times a day  Blood Thinner Instructions:N/A Aspirin Instructions: Last Dose:  Anesthesia review: N/A  Patient denies shortness of breath, fever, cough and chest pain at PAT appointment   Patient verbalized understanding of instructions that were given to them at the PAT appointment. Patient was also instructed that they will need to review over the PAT instructions again at home before surgery.

## 2019-05-11 ENCOUNTER — Encounter (HOSPITAL_COMMUNITY): Payer: Self-pay | Admitting: Surgery

## 2019-05-11 DIAGNOSIS — E210 Primary hyperparathyroidism: Secondary | ICD-10-CM | POA: Diagnosis present

## 2019-05-11 NOTE — H&P (Signed)
General Surgery [facility]  Cheyenne Mccoy DOB: [birth year] Divorced / Language: English / Race: White Female   History of Present Illness  The patient is a 53 year old female who presents with primary hyperparathyroidism.  CHIEF COMPLAINT: primary hyperparathyroidism  Patient returns to discuss results of diagnostic testing and recent laboratory studies in preparation for surgery. Patient had been referred by her endocrinologist, Dr. Jacelyn Pi, for suspected primary hyperparathyroidism. She had undergone a nuclear medicine parathyroid scan which suggested a right sided parathyroid adenoma. However, ultrasound examination failed to confirm anything on the right side. Likewise a CT scan using parathyroid 4D protocol on February 10, 2019 failed to identify a parathyroid adenoma. We repeated her laboratory studies last week and her calcium remains elevated at 11.4 and her PTH level remains unsuppressed at 50. Vitamin D level is normal at 38.4. Patient has had problems with bone and joint pain in the left leg. She presents today to discuss these findings and to discuss parathyroid neck exploration as the next step   Problem List/Past Medical  PRIMARY HYPERPARATHYROIDISM (E21.0)   Past Surgical History Colon Polyp Removal - Colonoscopy   Diagnostic Studies History  Colonoscopy  within last year Mammogram  within last year Pap Smear  1-5 years ago  Allergies  Penicillins  Swelling, Rash. Codeine/Codeine Derivatives  Swelling, Rash. Insect Stings  Bees Allergies Reconciled   Medication History  Vitamin D (Ergocalciferol) (50000UNIT Capsule, Oral) Active. Synthroid (50MCG Tablet, Oral) Active. Rosuvastatin Calcium (10MG  Tablet, Oral) Active. QUEtiapine Fumarate ER (300MG  Tablet ER 24HR, Oral) Active. Zolpidem Tartrate ER (12.5MG  Tablet ER, Oral) Active. Divalproex Sodium ER (500MG  Tablet ER 24HR, Oral) Active. glipiZIDE ER (5MG  Tablet  ER 24HR, Oral) Active. Losartan Potassium (100MG  Tablet, Oral) Active. metFORMIN HCl ER (500MG  Tablet ER 24HR, Oral) Active. Solifenacin Succinate (5MG  Tablet, Oral) Active. Adderall (30MG  Tablet, Oral) Active. Cyanocobalamin (1000MCG/ML Solution, Injection) Active. Fish Oil (1200MG  Capsule DR, Oral) Active. Trilipix (135MG  Capsule DR, Oral) Active. Zofran (8MG  Tablet, Oral) Active. Vraylar (3MG  Capsule, Oral) Active. Medications Reconciled  Social History Alcohol use  Occasional alcohol use. No caffeine use  No drug use  Tobacco use  Former smoker.  Family History Arthritis  Father.  Pregnancy / Birth History  Age at menarche  33 years. Gravida  0 Para  0  Other Problems  Anxiety Disorder  Depression  Diabetes Mellitus  High blood pressure  Thyroid Disease   Vitals  Weight: 255.25 lb Height: 67in Body Surface Area: 2.24 m Body Mass Index: 39.98 kg/m  Temp.: 97.107F (Oral)  Pulse: 105 (Regular)  BP: 126/68(Sitting, Left Arm, Standard)  Physical Exam   See vital signs recorded above  GENERAL APPEARANCE Development: normal Nutritional status: normal Gross deformities: none  SKIN Rash, lesions, ulcers: none Induration, erythema: none Nodules: none palpable  EYES Conjunctiva and lids: normal Pupils: equal and reactive Iris: normal bilaterally  EARS, NOSE, MOUTH, THROAT External ears: no lesion or deformity External nose: no lesion or deformity Hearing: grossly normal  NECK Symmetric: yes Trachea: midline Thyroid: no palpable nodules in the thyroid bed  CHEST Respiratory effort: normal Retraction or accessory muscle use: no Breath sounds: normal bilaterally Rales, rhonchi, wheeze: none  CARDIOVASCULAR Auscultation: regular rhythm, normal rate Murmurs: none Pulses: carotid and radial pulse 2+ palpable Lower extremity edema: none Lower extremity varicosities: none  MUSCULOSKELETAL Station and gait:  normal Digits and nails: no clubbing or cyanosis Muscle strength: grossly normal all extremities Range of motion: grossly normal all extremities  Deformity: none  LYMPHATIC Cervical: none palpable Supraclavicular: none palpable  PSYCHIATRIC Oriented to person, place, and time: yes Mood and affect: normal for situation Judgment and insight: appropriate for situation    Assessment & Plan  PRIMARY HYPERPARATHYROIDISM (E21.0)  I reviewed all of the diagnostic test results that we have done plus far with the patient today. We reviewed her recent laboratory studies and discussed how they vary from normal.  I presented her with the option of proceeding with parathyroid neck exploration in hopes of identifying a parathyroid adenoma. We discussed the risk and benefits of the surgery. We discussed the location of the surgical incision. We discussed the hospital stay to be anticipated. We discussed her postoperative recovery. We discussed what we would do in the event that we did not correct her calcium and parathyroid hormone levels back to normal with the surgery. The patient understands and wishes to proceed with surgery in the near future. We will make arrangements at a time convenient for the patient.  The risks and benefits of the procedure have been discussed at length with the patient. The patient understands the proposed procedure, potential alternative treatments, and the course of recovery to be expected. All of the patient's questions have been answered at this time. The patient wishes to proceed with surgery.  Darnell Levelodd Samba Cumba, MD [facility] Office: [phone]

## 2019-05-12 ENCOUNTER — Ambulatory Visit (HOSPITAL_COMMUNITY): Payer: Medicare Other | Admitting: Certified Registered"

## 2019-05-12 ENCOUNTER — Ambulatory Visit (HOSPITAL_COMMUNITY): Payer: Medicare Other | Admitting: Physician Assistant

## 2019-05-12 ENCOUNTER — Encounter (HOSPITAL_COMMUNITY): Payer: Self-pay

## 2019-05-12 ENCOUNTER — Observation Stay (HOSPITAL_COMMUNITY)
Admission: RE | Admit: 2019-05-12 | Discharge: 2019-05-13 | Disposition: A | Discharge status: Home / Self Care | Payer: Medicare Other | Attending: Surgery | Admitting: Surgery

## 2019-05-12 ENCOUNTER — Encounter (HOSPITAL_COMMUNITY)
Admission: RE | Disposition: A | Discharge status: Home / Self Care | Payer: Self-pay | Source: Home / Self Care | Attending: Surgery

## 2019-05-12 ENCOUNTER — Other Ambulatory Visit: Payer: Self-pay

## 2019-05-12 DIAGNOSIS — Z885 Allergy status to narcotic agent status: Secondary | ICD-10-CM | POA: Insufficient documentation

## 2019-05-12 DIAGNOSIS — F419 Anxiety disorder, unspecified: Secondary | ICD-10-CM | POA: Insufficient documentation

## 2019-05-12 DIAGNOSIS — E039 Hypothyroidism, unspecified: Secondary | ICD-10-CM | POA: Diagnosis not present

## 2019-05-12 DIAGNOSIS — Z87891 Personal history of nicotine dependence: Secondary | ICD-10-CM | POA: Diagnosis not present

## 2019-05-12 DIAGNOSIS — Z880 Allergy status to penicillin: Secondary | ICD-10-CM | POA: Diagnosis not present

## 2019-05-12 DIAGNOSIS — K219 Gastro-esophageal reflux disease without esophagitis: Secondary | ICD-10-CM | POA: Insufficient documentation

## 2019-05-12 DIAGNOSIS — R030 Elevated blood-pressure reading, without diagnosis of hypertension: Secondary | ICD-10-CM | POA: Insufficient documentation

## 2019-05-12 DIAGNOSIS — Z91030 Bee allergy status: Secondary | ICD-10-CM | POA: Insufficient documentation

## 2019-05-12 DIAGNOSIS — E210 Primary hyperparathyroidism: Secondary | ICD-10-CM | POA: Diagnosis not present

## 2019-05-12 DIAGNOSIS — Z86010 Personal history of colonic polyps: Secondary | ICD-10-CM | POA: Diagnosis not present

## 2019-05-12 DIAGNOSIS — Z79899 Other long term (current) drug therapy: Secondary | ICD-10-CM | POA: Insufficient documentation

## 2019-05-12 DIAGNOSIS — Z7984 Long term (current) use of oral hypoglycemic drugs: Secondary | ICD-10-CM | POA: Insufficient documentation

## 2019-05-12 DIAGNOSIS — I10 Essential (primary) hypertension: Secondary | ICD-10-CM | POA: Diagnosis not present

## 2019-05-12 DIAGNOSIS — F319 Bipolar disorder, unspecified: Secondary | ICD-10-CM | POA: Diagnosis not present

## 2019-05-12 DIAGNOSIS — E119 Type 2 diabetes mellitus without complications: Secondary | ICD-10-CM | POA: Insufficient documentation

## 2019-05-12 DIAGNOSIS — F418 Other specified anxiety disorders: Secondary | ICD-10-CM | POA: Diagnosis not present

## 2019-05-12 HISTORY — PX: PARATHYROIDECTOMY: SHX19

## 2019-05-12 LAB — GLUCOSE, CAPILLARY
Glucose-Capillary: 169 mg/dL — ABNORMAL HIGH (ref 70–99)
Glucose-Capillary: 155 mg/dL — ABNORMAL HIGH (ref 70–99)
Glucose-Capillary: 170 mg/dL — ABNORMAL HIGH (ref 70–99)
Glucose-Capillary: 177 mg/dL — ABNORMAL HIGH (ref 70–99)
Glucose-Capillary: 144 mg/dL — ABNORMAL HIGH (ref 70–99)

## 2019-05-12 SURGERY — PARATHYROIDECTOMY
Anesthesia: General | Site: Neck

## 2019-05-12 MED ORDER — FENTANYL CITRATE (PF) 100 MCG/2ML IJ SOLN
INTRAMUSCULAR | Status: AC
  Filled 2019-05-12: qty 2

## 2019-05-12 MED ORDER — SUGAMMADEX SODIUM 200 MG/2ML IV SOLN
INTRAVENOUS | Status: DC | PRN
  Administered 2019-05-12: 200 mg via INTRAVENOUS

## 2019-05-12 MED ORDER — ONDANSETRON HCL 4 MG/2ML IJ SOLN
INTRAMUSCULAR | Status: DC | PRN
  Administered 2019-05-12: 4 mg via INTRAVENOUS

## 2019-05-12 MED ORDER — MIDAZOLAM HCL 2 MG/2ML IJ SOLN
INTRAMUSCULAR | Status: AC
  Filled 2019-05-12: qty 2

## 2019-05-12 MED ORDER — ACETAMINOPHEN 650 MG RE SUPP: 650.0000 mg | Freq: Four times a day (QID) | RECTAL | Status: DC | PRN

## 2019-05-12 MED ORDER — BUPIVACAINE HCL (PF) 0.25 % IJ SOLN
INTRAMUSCULAR | Status: AC
  Filled 2019-05-12: qty 30

## 2019-05-12 MED ORDER — INSULIN ASPART 100 UNIT/ML ~~LOC~~ SOLN
0.0000 [IU] | Freq: Three times a day (TID) | SUBCUTANEOUS | Status: DC
  Administered 2019-05-12 (×2): 3 [IU] via SUBCUTANEOUS

## 2019-05-12 MED ORDER — OXYCODONE HCL 5 MG/5ML PO SOLN: 5.0000 mg | Freq: Once | ORAL | Status: DC | PRN

## 2019-05-12 MED ORDER — LEVOTHYROXINE SODIUM 50 MCG PO TABS
50.0000 ug | ORAL_TABLET | Freq: Every day | ORAL | Status: DC
  Administered 2019-05-13: 05:00:00 50 ug via ORAL
  Filled 2019-05-12: qty 1

## 2019-05-12 MED ORDER — LOSARTAN POTASSIUM 50 MG PO TABS
100.0000 mg | ORAL_TABLET | Freq: Every day | ORAL | Status: DC
  Administered 2019-05-12 – 2019-05-13 (×2): 100 mg via ORAL
  Filled 2019-05-12 (×2): qty 2

## 2019-05-12 MED ORDER — ONDANSETRON HCL 4 MG/2ML IJ SOLN
INTRAMUSCULAR | Status: AC
  Filled 2019-05-12: qty 2

## 2019-05-12 MED ORDER — PROPOFOL 10 MG/ML IV BOLUS
INTRAVENOUS | Status: AC
  Filled 2019-05-12: qty 40

## 2019-05-12 MED ORDER — DIVALPROEX SODIUM ER 500 MG PO TB24
1000.0000 mg | ORAL_TABLET | Freq: Every day | ORAL | Status: DC
  Administered 2019-05-12: 1000 mg via ORAL
  Filled 2019-05-12: qty 2

## 2019-05-12 MED ORDER — MIDAZOLAM HCL 2 MG/2ML IJ SOLN
INTRAMUSCULAR | Status: DC | PRN
  Administered 2019-05-12: 2 mg via INTRAVENOUS

## 2019-05-12 MED ORDER — ONDANSETRON HCL 4 MG/2ML IJ SOLN: 4.0000 mg | Freq: Four times a day (QID) | INTRAMUSCULAR | Status: DC | PRN

## 2019-05-12 MED ORDER — QUETIAPINE FUMARATE ER 300 MG PO TB24
300.0000 mg | ORAL_TABLET | Freq: Every day | ORAL | Status: DC
  Administered 2019-05-12: 300 mg via ORAL
  Filled 2019-05-12: qty 1

## 2019-05-12 MED ORDER — LACTATED RINGERS IV SOLN
INTRAVENOUS | Status: DC
  Administered 2019-05-12: 06:00:00 via INTRAVENOUS

## 2019-05-12 MED ORDER — BUPIVACAINE HCL 0.25 % IJ SOLN
INTRAMUSCULAR | Status: DC | PRN
  Administered 2019-05-12: 10 mL

## 2019-05-12 MED ORDER — QUETIAPINE FUMARATE ER 50 MG PO TB24
150.0000 mg | ORAL_TABLET | Freq: Every day | ORAL | Status: DC
  Administered 2019-05-12: 150 mg via ORAL
  Filled 2019-05-12 (×2): qty 3

## 2019-05-12 MED ORDER — ZOLPIDEM TARTRATE 5 MG PO TABS: 5.0000 mg | ORAL_TABLET | Freq: Every evening | ORAL | Status: DC | PRN

## 2019-05-12 MED ORDER — ACETAMINOPHEN 325 MG PO TABS: 650.0000 mg | ORAL_TABLET | Freq: Four times a day (QID) | ORAL | Status: DC | PRN

## 2019-05-12 MED ORDER — LIDOCAINE 2% (20 MG/ML) 5 ML SYRINGE
INTRAMUSCULAR | Status: AC
  Filled 2019-05-12: qty 5

## 2019-05-12 MED ORDER — FENOFIBRATE 160 MG PO TABS
160.0000 mg | ORAL_TABLET | Freq: Every day | ORAL | Status: DC
  Administered 2019-05-12 – 2019-05-13 (×2): 160 mg via ORAL
  Filled 2019-05-12 (×2): qty 1

## 2019-05-12 MED ORDER — DEXAMETHASONE SODIUM PHOSPHATE 10 MG/ML IJ SOLN
INTRAMUSCULAR | Status: AC
  Filled 2019-05-12: qty 1

## 2019-05-12 MED ORDER — PROPOFOL 10 MG/ML IV BOLUS
INTRAVENOUS | Status: DC | PRN
  Administered 2019-05-12: 200 mg via INTRAVENOUS
  Administered 2019-05-12: 30 mg via INTRAVENOUS

## 2019-05-12 MED ORDER — GLIPIZIDE ER 5 MG PO TB24
5.0000 mg | ORAL_TABLET | Freq: Every day | ORAL | Status: DC
  Administered 2019-05-13: 08:00:00 5 mg via ORAL
  Filled 2019-05-12: qty 1

## 2019-05-12 MED ORDER — ALPRAZOLAM 1 MG PO TABS: 1.0000 mg | ORAL_TABLET | Freq: Four times a day (QID) | ORAL | Status: DC | PRN

## 2019-05-12 MED ORDER — HYDROMORPHONE HCL 1 MG/ML IJ SOLN: 1.0000 mg | INTRAMUSCULAR | Status: DC | PRN

## 2019-05-12 MED ORDER — ONDANSETRON 4 MG PO TBDP: 4.0000 mg | ORAL_TABLET | Freq: Four times a day (QID) | ORAL | Status: DC | PRN

## 2019-05-12 MED ORDER — ROCURONIUM BROMIDE 10 MG/ML (PF) SYRINGE
PREFILLED_SYRINGE | INTRAVENOUS | Status: AC
  Filled 2019-05-12: qty 10

## 2019-05-12 MED ORDER — CHLORHEXIDINE GLUCONATE CLOTH 2 % EX PADS: 6.0000 | MEDICATED_PAD | Freq: Once | CUTANEOUS | Status: DC

## 2019-05-12 MED ORDER — DEXAMETHASONE SODIUM PHOSPHATE 10 MG/ML IJ SOLN
INTRAMUSCULAR | Status: DC | PRN
  Administered 2019-05-12: 8 mg via INTRAVENOUS

## 2019-05-12 MED ORDER — OXYCODONE HCL 5 MG PO TABS: 5.0000 mg | ORAL_TABLET | Freq: Once | ORAL | Status: DC | PRN

## 2019-05-12 MED ORDER — METFORMIN HCL ER 500 MG PO TB24
2000.0000 mg | ORAL_TABLET | Freq: Every day | ORAL | Status: DC
  Administered 2019-05-12: 2000 mg via ORAL
  Filled 2019-05-12: qty 4

## 2019-05-12 MED ORDER — POTASSIUM CHLORIDE IN NACL 20-0.45 MEQ/L-% IV SOLN
INTRAVENOUS | Status: DC
  Administered 2019-05-12: 14:00:00 via INTRAVENOUS
  Filled 2019-05-12 (×2): qty 1000

## 2019-05-12 MED ORDER — ROCURONIUM BROMIDE 10 MG/ML (PF) SYRINGE
PREFILLED_SYRINGE | INTRAVENOUS | Status: DC | PRN
  Administered 2019-05-12: 50 mg via INTRAVENOUS

## 2019-05-12 MED ORDER — TRAMADOL HCL 50 MG PO TABS: 50.0000 mg | ORAL_TABLET | Freq: Four times a day (QID) | ORAL | Status: DC | PRN

## 2019-05-12 MED ORDER — FENTANYL CITRATE (PF) 250 MCG/5ML IJ SOLN
INTRAMUSCULAR | Status: DC | PRN
  Administered 2019-05-12: 25 ug via INTRAVENOUS
  Administered 2019-05-12: 100 ug via INTRAVENOUS
  Administered 2019-05-12 (×2): 25 ug via INTRAVENOUS
  Administered 2019-05-12 (×2): 50 ug via INTRAVENOUS
  Administered 2019-05-12: 25 ug via INTRAVENOUS

## 2019-05-12 MED ORDER — FENTANYL CITRATE (PF) 100 MCG/2ML IJ SOLN
25.0000 ug | INTRAMUSCULAR | Status: DC | PRN
  Administered 2019-05-12 (×2): 50 ug via INTRAVENOUS

## 2019-05-12 MED ORDER — 0.9 % SODIUM CHLORIDE (POUR BTL) OPTIME
TOPICAL | Status: DC | PRN
  Administered 2019-05-12: 09:00:00 1000 mL

## 2019-05-12 MED ORDER — CIPROFLOXACIN IN D5W 400 MG/200ML IV SOLN
400.0000 mg | INTRAVENOUS | Status: AC
  Administered 2019-05-12: 08:00:00 400 mg via INTRAVENOUS
  Filled 2019-05-12: qty 200

## 2019-05-12 MED ORDER — LIDOCAINE 2% (20 MG/ML) 5 ML SYRINGE
INTRAMUSCULAR | Status: DC | PRN
  Administered 2019-05-12: 60 mg via INTRAVENOUS

## 2019-05-12 SURGICAL SUPPLY — 30 items
ATTRACTOMAT 16X20 MAGNETIC DRP (DRAPES) ×2 IMPLANT
BLADE SURG 15 STRL LF DISP TIS (BLADE) ×1 IMPLANT
BLADE SURG 15 STRL SS (BLADE) ×1
CHLORAPREP W/TINT 26 (MISCELLANEOUS) ×2 IMPLANT
CLIP VESOCCLUDE MED 6/CT (CLIP) ×4 IMPLANT
CLIP VESOCCLUDE SM WIDE 6/CT (CLIP) ×4 IMPLANT
COVER SURGICAL LIGHT HANDLE (MISCELLANEOUS) ×2 IMPLANT
COVER WAND RF STERILE (DRAPES) ×1 IMPLANT
DERMABOND ADVANCED (GAUZE/BANDAGES/DRESSINGS) ×1
DERMABOND ADVANCED .7 DNX12 (GAUZE/BANDAGES/DRESSINGS) IMPLANT
DRAPE LAPAROTOMY T 98X78 PEDS (DRAPES) ×2 IMPLANT
ELECT PENCIL ROCKER SW 15FT (MISCELLANEOUS) ×2 IMPLANT
ELECT REM PT RETURN 15FT ADLT (MISCELLANEOUS) ×2 IMPLANT
GAUZE 4X4 16PLY RFD (DISPOSABLE) ×2 IMPLANT
GLOVE SURG ORTHO 8.0 STRL STRW (GLOVE) ×2 IMPLANT
GOWN STRL REUS W/TWL XL LVL3 (GOWN DISPOSABLE) ×6 IMPLANT
HEMOSTAT SURGICEL 2X4 FIBR (HEMOSTASIS) IMPLANT
ILLUMINATOR WAVEGUIDE N/F (MISCELLANEOUS) IMPLANT
KIT BASIN OR (CUSTOM PROCEDURE TRAY) ×2 IMPLANT
KIT TURNOVER KIT A (KITS) ×1 IMPLANT
NDL HYPO 25X1 1.5 SAFETY (NEEDLE) ×1 IMPLANT
NEEDLE HYPO 25X1 1.5 SAFETY (NEEDLE) ×2 IMPLANT
PACK BASIC VI WITH GOWN DISP (CUSTOM PROCEDURE TRAY) ×2 IMPLANT
SUT MNCRL AB 4-0 PS2 18 (SUTURE) ×2 IMPLANT
SUT VIC AB 3-0 SH 18 (SUTURE) ×3 IMPLANT
SYR BULB IRRIGATION 50ML (SYRINGE) ×2 IMPLANT
SYR CONTROL 10ML LL (SYRINGE) ×2 IMPLANT
TOWEL OR 17X26 10 PK STRL BLUE (TOWEL DISPOSABLE) ×2 IMPLANT
TOWEL OR NON WOVEN STRL DISP B (DISPOSABLE) ×2 IMPLANT
TUBING CONNECTING 10 (TUBING) ×2 IMPLANT

## 2019-05-12 NOTE — Anesthesia Procedure Notes (Signed)
Procedure Name: Intubation Date/Time: 05/12/2019 7:33 AM Performed by: Eben Burow, CRNA Pre-anesthesia Checklist: Patient identified, Emergency Drugs available, Suction available, Patient being monitored and Timeout performed Patient Re-evaluated:Patient Re-evaluated prior to induction Oxygen Delivery Method: Circle system utilized Preoxygenation: Pre-oxygenation with 100% oxygen Induction Type: IV induction Ventilation: Mask ventilation without difficulty Laryngoscope Size: Mac and 4 Grade View: Grade II Tube type: Oral Tube size: 7.0 mm Number of attempts: 1 Airway Equipment and Method: Stylet Placement Confirmation: ETT inserted through vocal cords under direct vision,  positive ETCO2 and breath sounds checked- equal and bilateral Secured at: 22 cm Tube secured with: Tape Dental Injury: Teeth and Oropharynx as per pre-operative assessment

## 2019-05-12 NOTE — Transfer of Care (Signed)
Immediate Anesthesia Transfer of Care Note  Patient: Cheyenne Mccoy  Procedure(s) Performed: NECK EXPLORATION WITH PARATHYROIDECTOMY (N/A Neck)  Patient Location: PACU  Anesthesia Type:General  Level of Consciousness: drowsy  Airway & Oxygen Therapy: Patient Spontanous Breathing and Patient connected to face mask oxygen  Post-op Assessment: Report given to RN and Post -op Vital signs reviewed and stable  Post vital signs: Reviewed and stable  Last Vitals:  Vitals Value Taken Time  BP    Temp    Pulse 90 05/12/19 0912  Resp 22 05/12/19 0912  SpO2 98 % 05/12/19 0912  Vitals shown include unvalidated device data.  Last Pain:  Vitals:   05/12/19 0600  TempSrc: Oral  PainSc:          Complications: No apparent anesthesia complications

## 2019-05-12 NOTE — Interval H&P Note (Signed)
History and Physical Interval Note:  05/12/2019 7:14 AM  Cheyenne Mccoy  has presented today for surgery, with the diagnosis of PRIMARY HYPERPARATHYOIDISM.  The various methods of treatment have been discussed with the patient and family. After consideration of risks, benefits and other options for treatment, the patient has consented to    Procedure(s): NECK EXPLORATION WITH PARATHYROIDECTOMY (N/A) as a surgical intervention.    The patient's history has been reviewed, patient examined, no change in status, stable for surgery.  I have reviewed the patient's chart and labs.  Questions were answered to the patient's satisfaction.    Armandina Gemma, [facility] Office: [place]

## 2019-05-12 NOTE — Anesthesia Preprocedure Evaluation (Signed)
Anesthesia Evaluation  Patient identified by MRN, date of birth, ID band Patient awake    Reviewed: Allergy & Precautions, H&P , NPO status , Patient's Chart, lab work & pertinent test results  Airway Mallampati: II   Neck ROM: full    Dental   Pulmonary former smoker,    breath sounds clear to auscultation       Cardiovascular hypertension,  Rhythm:regular Rate:Normal     Neuro/Psych PSYCHIATRIC DISORDERS Anxiety Depression Bipolar Disorder    GI/Hepatic GERD  ,  Endo/Other  diabetes, Type 2Hypothyroidism   Renal/GU      Musculoskeletal   Abdominal   Peds  Hematology   Anesthesia Other Findings   Reproductive/Obstetrics                             Anesthesia Physical Anesthesia Plan  ASA: II  Anesthesia Plan: General   Post-op Pain Management:    Induction: Intravenous  PONV Risk Score and Plan: 3 and Ondansetron, Dexamethasone, Midazolam and Treatment may vary due to age or medical condition  Airway Management Planned: Oral ETT  Additional Equipment:   Intra-op Plan:   Post-operative Plan: Extubation in OR  Informed Consent: I have reviewed the patients History and Physical, chart, labs and discussed the procedure including the risks, benefits and alternatives for the proposed anesthesia with the patient or authorized representative who has indicated his/her understanding and acceptance.       Plan Discussed with: CRNA, Anesthesiologist and Surgeon  Anesthesia Plan Comments:         Anesthesia Quick Evaluation

## 2019-05-12 NOTE — Op Note (Signed)
Operative Note  Pre-operative Diagnosis:  Primary hyperparathyroidism  Post-operative Diagnosis:  same  Surgeon:  Armandina Gemma, MD  Assistant:  Carlena Hurl, PA-C   Procedure:  Neck exploration with right inferior parathyroidectomy, thyroid biopsy  Anesthesia:  general  Estimated Blood Loss:  minimal  Drains: none         Specimen: right inferior parathyroid, biopsy right thyroid lobe  Indications:  Patient returns to discuss results of diagnostic testing and recent laboratory studies in preparation for surgery. Patient had been referred by her endocrinologist, Dr. Jacelyn Pi, for suspected primary hyperparathyroidism. She had undergone a nuclear medicine parathyroid scan which suggested a right sided parathyroid adenoma. However, ultrasound examination failed to confirm anything on the right side. Likewise a CT scan using parathyroid 4D protocol on February 10, 2019 failed to identify a parathyroid adenoma. We repeated her laboratory studies last week and her calcium remains elevated at 11.4 and her PTH level remains unsuppressed at 50. Vitamin D level is normal at 38.4. Patient has had problems with bone and joint pain in the left leg. She presents today for neck exploration and parathyroidectomy.  Procedure Details:  The patient was seen in the pre-op holding area. The risks, benefits, complications, treatment options, and expected outcomes were previously discussed with the patient. The patient agreed with the proposed plan and has signed the informed consent form.  The patient was brought to the operating room by the surgical team, identified as Cheyenne Mccoy and the procedure verified. A "time out" was completed and the above information confirmed.  Following administration of general anesthesia, the patient was positioned and then prepped and draped in the usual aseptic fashion.  After ascertaining that an adequate level of anesthesia been achieved, a Kocher incision is made  with a #15 blade.  Dissection is carried through the subcutaneous tissues and platysma.  Hemostasis was achieved with the electrocautery.  Skin flaps were developed cephalad and caudad from the thyroid notch to the sternal notch.  A Mahorner self-retaining retractors placed for exposure.  Strap muscles are incised in the midline and dissection is begun on the right side.  Right thyroid lobe appears normal.  It is mobilized from its lateral attachments.  Strap muscles are reflected laterally.  Gland is rolled anteriorly.  Exploration reveals an abnormally enlarged right inferior parathyroid gland just below the level of the inferior thyroid artery.  This is gently mobilized.  Further dissection reveals a prominent nodule just above the inferior thyroid artery which may represent the tubercle of Zuckerkandl.  Next we turned our attention to the left side.  Strap muscles are again reflected laterally.  Left thyroid lobe is mobilized.  Middle thyroid vein is divided between small ligaclips.  Exploration posterior to the left thyroid lobe reveals what appears to be a normal left superior parathyroid gland.  Further dissection inferiorly fails to reveal any enlarged parathyroid tissue.  We turned our attention back to the right side.  The right inferior parathyroid gland is then resected using small ligaclips to divide the vascular pedicle.  Gland is submitted to pathology where Dr. Jacki Cones confirms hypercellular parathyroid tissue consistent with parathyroid adenoma.  A biopsy is taken of the thyroid nodule just above the inferior thyroid artery using a medium Ligaclip.  Tissue was reviewed by Dr. Tammi Klippel and confirmed to be thyroid tissue.  Further exploration superiorly fails to reveal any enlarged parathyroid tissue.  There does appear to be a small normal-appearing parathyroid posterior to the superior pole of  the thyroid lobe.  Decision is made to discontinue further exploration.  Neck is irrigated  with warm saline and good hemostasis is achieved throughout the operative field.  Fibrillar is placed throughout the operative field.  Strap muscles are reapproximated in the midline of interrupted 3-0 Vicryl sutures.  Platysma was closed with interrupted 3-0 Vicryl sutures.  Skin is anesthetized with local anesthetic.  Skin edges are approximated with a running 4-0 Monocryl subcuticular suture.  Wound is washed and dried and Dermabond is applied as dressing.  Patient is awakened from anesthesia and taken to the recovery room in stable condition.  The patient tolerated the procedure well.   Cheyenne Levelodd Mallie Giambra, MD [facility] Office: [phone]

## 2019-05-12 NOTE — Anesthesia Postprocedure Evaluation (Signed)
Anesthesia Post Note  Patient: Cheyenne Mccoy  Procedure(s) Performed: NECK EXPLORATION WITH PARATHYROIDECTOMY (N/A Neck)     Patient location during evaluation: PACU Anesthesia Type: General Level of consciousness: awake and alert Pain management: pain level controlled Vital Signs Assessment: post-procedure vital signs reviewed and stable Respiratory status: spontaneous breathing, nonlabored ventilation, respiratory function stable and patient connected to nasal cannula oxygen Cardiovascular status: blood pressure returned to baseline and stable Postop Assessment: no apparent nausea or vomiting Anesthetic complications: no    Last Vitals:  Vitals:   05/12/19 1806 05/12/19 2102  BP: (!) 154/100 (!) 151/92  Pulse: 91 90  Resp: 18 20  Temp: 37 C 37.2 C  SpO2: 96% 98%    Last Pain:  Vitals:   05/12/19 2102  TempSrc: Oral  PainSc:                  [place]

## 2019-05-13 ENCOUNTER — Encounter (HOSPITAL_COMMUNITY): Payer: Self-pay | Admitting: Surgery

## 2019-05-13 DIAGNOSIS — F419 Anxiety disorder, unspecified: Secondary | ICD-10-CM | POA: Diagnosis not present

## 2019-05-13 DIAGNOSIS — K219 Gastro-esophageal reflux disease without esophagitis: Secondary | ICD-10-CM | POA: Diagnosis not present

## 2019-05-13 DIAGNOSIS — Z885 Allergy status to narcotic agent status: Secondary | ICD-10-CM | POA: Diagnosis not present

## 2019-05-13 DIAGNOSIS — F319 Bipolar disorder, unspecified: Secondary | ICD-10-CM | POA: Diagnosis not present

## 2019-05-13 DIAGNOSIS — Z86010 Personal history of colonic polyps: Secondary | ICD-10-CM | POA: Diagnosis not present

## 2019-05-13 DIAGNOSIS — R030 Elevated blood-pressure reading, without diagnosis of hypertension: Secondary | ICD-10-CM | POA: Diagnosis not present

## 2019-05-13 DIAGNOSIS — Z91030 Bee allergy status: Secondary | ICD-10-CM | POA: Diagnosis not present

## 2019-05-13 DIAGNOSIS — Z7984 Long term (current) use of oral hypoglycemic drugs: Secondary | ICD-10-CM | POA: Diagnosis not present

## 2019-05-13 DIAGNOSIS — E119 Type 2 diabetes mellitus without complications: Secondary | ICD-10-CM | POA: Diagnosis not present

## 2019-05-13 DIAGNOSIS — I10 Essential (primary) hypertension: Secondary | ICD-10-CM | POA: Diagnosis not present

## 2019-05-13 DIAGNOSIS — Z87891 Personal history of nicotine dependence: Secondary | ICD-10-CM | POA: Diagnosis not present

## 2019-05-13 DIAGNOSIS — Z79899 Other long term (current) drug therapy: Secondary | ICD-10-CM | POA: Diagnosis not present

## 2019-05-13 DIAGNOSIS — E210 Primary hyperparathyroidism: Secondary | ICD-10-CM | POA: Diagnosis not present

## 2019-05-13 DIAGNOSIS — Z880 Allergy status to penicillin: Secondary | ICD-10-CM | POA: Diagnosis not present

## 2019-05-13 LAB — BASIC METABOLIC PANEL
Anion gap: 7 (ref 5–15)
BUN: 18 mg/dL (ref 6–20)
CO2: 24 mmol/L (ref 22–32)
Calcium: 9.6 mg/dL (ref 8.9–10.3)
Chloride: 107 mmol/L (ref 98–111)
Creatinine, Ser: 0.92 mg/dL (ref 0.44–1.00)
GFR calc Af Amer: >60 mL/min (ref >60)
GFR calc non Af Amer: >60 mL/min (ref >60)
Glucose, Bld: 113 mg/dL — ABNORMAL HIGH (ref 70–99)
Potassium: 3.8 mmol/L (ref 3.5–5.1)
Sodium: 138 mmol/L (ref 135–145)

## 2019-05-13 LAB — GLUCOSE, CAPILLARY: Glucose-Capillary: 111 mg/dL — ABNORMAL HIGH (ref 70–99)

## 2019-05-13 MED ORDER — TRAMADOL HCL 50 MG PO TABS: 50.0000 mg | ORAL_TABLET | Freq: Four times a day (QID) | ORAL | 0 refills | Status: DC | PRN

## 2019-05-13 NOTE — Plan of Care (Signed)
Pt was discharged home today. Instructions were reviewed with patient, and questions were answered. Pt was taken to main entrance via wheelchair by NT.  

## 2019-05-13 NOTE — Discharge Summary (Signed)
Physician Discharge Summary [facility]  Patient ID: Cheyenne Mccoy MRN: [id number] DOB/AGE: [id number] 54 y.o.  Admit date: 05/12/2019 Discharge date: 05/13/2019  Admission Diagnoses:  Primary hyperparathyroidism  Discharge Diagnoses:  Principal Problem:   Hyperparathyroidism, primary ([place]) Active Problems:   Primary hyperparathyroidism [facility])   Discharged Condition: good  Hospital Course: Patient was admitted for observation following parathyroid surgery.  Post op course was uncomplicated.  Pain was well controlled.  Tolerated diet.  Post op calcium level on morning following surgery was 9.6 mg/dl.  Patient was prepared for discharge home on POD#1.  Consults: None  Treatments: surgery: neck exploration with parathyroidectomy  Discharge Exam: Blood pressure (!) 142/83, pulse 85, temperature 98.4 F (36.9 C), temperature source Oral, resp. rate 20, height 5\' 7"  (1.702 m), weight 116.1 kg, SpO2 95 %. HEENT - clear Neck - wound dry and intact, mild STS; voice normal Chest - clear bilaterally Cor - RRR   Disposition: Home  Discharge Instructions    Diet - low sodium heart healthy   Complete by: As directed    Discharge instructions   Complete by: As directed    [facility]  THYROID & PARATHYROID SURGERY:  POST-OP INSTRUCTIONS  Always review your discharge instruction sheet from the facility where your surgery was performed.  A prescription for pain medication may be given to you upon discharge.  Take your pain medication as prescribed.  If narcotic pain medicine is not needed, then you may take acetaminophen (Tylenol) or ibuprofen (Advil) as needed.  Take your usually prescribed medications unless otherwise directed.  If you need a refill on your pain medication, please contact our office during regular business hours.  Prescriptions cannot be processed by our office after 5 pm or on weekends.  Start with a light diet upon  arrival home, such as soup and crackers or toast.  Be sure to drink plenty of fluids daily.  Resume your normal diet the day after surgery.  Most patients will experience some swelling and bruising on the chest and neck area.  Ice packs will help.  Swelling and bruising can take several days to resolve.   It is common to experience some constipation after surgery.  Increasing fluid intake and taking a stool softener (Colace) will usually help or prevent this problem.  A mild laxative (Milk of Magnesia or Miralax) should be taken according to package directions if there has been no bowel movement after 48 hours.  You have steri-strips and a gauze dressing over your incision.  You may remove the gauze bandage on the second day after surgery, and you may shower at that time.  Leave your steri-strips (small skin tapes) in place directly over the incision.  These strips should remain on the skin for 5-7 days and then be removed.  You may get them wet in the shower and pat them dry.  You may resume regular (light) daily activities beginning the next day (such as daily self-care, walking, climbing stairs) gradually increasing activities as tolerated.  You may have sexual intercourse when it is comfortable.  Refrain from any heavy lifting or straining until approved by your doctor.  You may drive when you no longer are taking prescription pain medication, you can comfortably wear a seatbelt, and you can safely maneuver your car and apply brakes.  You should see your doctor in the office for a follow-up appointment approximately three weeks after your surgery.  Make sure that you call  for this appointment within a day or two after you arrive home to insure a convenient appointment time.  WHEN TO CALL YOUR DOCTOR: -- Fever greater than 101.5 -- Inability to urinate -- Nausea and/or vomiting - persistent -- Extreme swelling or bruising -- Continued bleeding from incision -- Increased pain, redness, or  drainage from the incision -- Difficulty swallowing or breathing -- Muscle cramping or spasms -- Numbness or tingling in hands or around lips  The clinic staff is available to answer your questions during regular business hours.  Please don't hesitate to call and ask to speak to one of the nurses if you have concerns.  Darnell Level, MD [facility] Office: [phone]   Increase activity slowly   Complete by: As directed    No dressing needed   Complete by: As directed      Allergies as of 05/13/2019      Reactions   Bee Venom Anaphylaxis   Codeine Anaphylaxis, Hives   Penicillins Anaphylaxis, Hives   Did it involve swelling of the face/tongue/throat, SOB, or low BP? Yes Did it involve sudden or severe rash/hives, skin peeling, or any reaction on the inside of your mouth or nose? No Did you need to seek medical attention at a hospital or doctor's office? Yes When did it last happen?Childhood allergy If all above answers are "NO", may proceed with cephalosporin use.      Medication List    TAKE these medications   acetaminophen 325 MG tablet Commonly known as: TYLENOL Take 650 mg by mouth every 6 (six) hours as needed for moderate pain.   ALPRAZolam 1 MG tablet Commonly known as: XANAX Take 1-2 mg by mouth 4 (four) times daily as needed for anxiety.   amphetamine-dextroamphetamine 30 MG 24 hr capsule Commonly known as: ADDERALL XR Take 30 mg by mouth daily as needed (ADHD).   cyanocobalamin 1000 MCG/ML injection Commonly known as: (VITAMIN B-12) Inject 1,000 mcg into the muscle every 30 (thirty) days.   divalproex 500 MG 24 hr tablet Commonly known as: DEPAKOTE ER Take 1,000 mg by mouth at bedtime.   Fish Oil 1200 MG Caps Take 2,400 mg by mouth at bedtime.   glipiZIDE 5 MG 24 hr tablet Commonly known as: GLUCOTROL XL Take 5 mg by mouth daily with breakfast.   levothyroxine 50 MCG tablet Commonly known as: SYNTHROID Take 50 mcg by mouth  daily before breakfast.   losartan 100 MG tablet Commonly known as: COZAAR Take 100 mg by mouth daily.   metFORMIN 500 MG 24 hr tablet Commonly known as: GLUCOPHAGE-XR Take 2,000 mg by mouth at bedtime.   QUEtiapine Fumarate 150 MG 24 hr tablet Commonly known as: SEROQUEL XR Take 150 mg by mouth at bedtime.   QUEtiapine 300 MG 24 hr tablet Commonly known as: SEROQUEL XR Take 300 mg by mouth at bedtime.   rosuvastatin 10 MG tablet Commonly known as: CRESTOR Take 10 mg by mouth daily.   traMADol 50 MG tablet Commonly known as: Ultram Take 1 tablet (50 mg total) by mouth every 6 (six) hours as needed. What changed: Another medication with the same name was added. Make sure you understand how and when to take each.   traMADol 50 MG tablet Commonly known as: ULTRAM Take 1-2 tablets (50-100 mg total) by mouth every 6 (six) hours as needed. What changed: You were already taking a medication with the same name, and this prescription was added. Make sure you understand how and  when to take each.   Trilipix 135 MG capsule Generic drug: Choline Fenofibrate Take 135 mg by mouth daily.   Vitamin D (Ergocalciferol) 1.25 MG (50000 UT) Caps capsule Commonly known as: DRISDOL Take 50,000 Units by mouth 2 (two) times a week.   zolpidem 12.5 MG CR tablet Commonly known as: AMBIEN CR Take 12.5 mg by mouth at bedtime as needed for sleep.      Follow-up Information    Darnell LevelGerkin, Glorian Mcdonell, MD. Schedule an appointment as soon as possible for a visit in 3 week(s).   Specialty: General Surgery Contact information: [address] [place] [address] [phone]           Velora Hecklerodd M. Youssouf Shipley, MD, [facility] Office: [phone]   Signed: Darnell Levelodd Greenlee Ancheta 05/13/2019, 9:02 AM

## 2019-06-06 DIAGNOSIS — E210 Primary hyperparathyroidism: Secondary | ICD-10-CM | POA: Diagnosis not present

## 2019-07-01 DIAGNOSIS — F319 Bipolar disorder, unspecified: Secondary | ICD-10-CM | POA: Diagnosis not present

## 2019-07-01 DIAGNOSIS — I10 Essential (primary) hypertension: Secondary | ICD-10-CM | POA: Diagnosis not present

## 2019-07-11 DIAGNOSIS — I10 Essential (primary) hypertension: Secondary | ICD-10-CM | POA: Diagnosis not present

## 2019-07-17 DIAGNOSIS — Z23 Encounter for immunization: Secondary | ICD-10-CM | POA: Diagnosis not present

## 2019-07-17 DIAGNOSIS — R739 Hyperglycemia, unspecified: Secondary | ICD-10-CM | POA: Diagnosis not present

## 2019-07-22 DIAGNOSIS — I10 Essential (primary) hypertension: Secondary | ICD-10-CM | POA: Diagnosis not present

## 2019-07-22 DIAGNOSIS — Z7984 Long term (current) use of oral hypoglycemic drugs: Secondary | ICD-10-CM | POA: Diagnosis not present

## 2019-07-22 DIAGNOSIS — E1165 Type 2 diabetes mellitus with hyperglycemia: Secondary | ICD-10-CM | POA: Diagnosis not present

## 2019-08-19 DIAGNOSIS — I10 Essential (primary) hypertension: Secondary | ICD-10-CM | POA: Diagnosis not present

## 2019-08-19 DIAGNOSIS — E039 Hypothyroidism, unspecified: Secondary | ICD-10-CM | POA: Diagnosis not present

## 2019-08-19 DIAGNOSIS — Z0001 Encounter for general adult medical examination with abnormal findings: Secondary | ICD-10-CM | POA: Diagnosis not present

## 2019-08-19 DIAGNOSIS — F419 Anxiety disorder, unspecified: Secondary | ICD-10-CM | POA: Diagnosis not present

## 2019-09-26 DIAGNOSIS — E119 Type 2 diabetes mellitus without complications: Secondary | ICD-10-CM | POA: Diagnosis not present

## 2019-10-07 DIAGNOSIS — I10 Essential (primary) hypertension: Secondary | ICD-10-CM | POA: Diagnosis not present

## 2019-10-07 DIAGNOSIS — E7800 Pure hypercholesterolemia, unspecified: Secondary | ICD-10-CM | POA: Diagnosis not present

## 2019-10-07 DIAGNOSIS — E039 Hypothyroidism, unspecified: Secondary | ICD-10-CM | POA: Diagnosis not present

## 2019-10-07 DIAGNOSIS — E1169 Type 2 diabetes mellitus with other specified complication: Secondary | ICD-10-CM | POA: Diagnosis not present

## 2019-10-21 DIAGNOSIS — Z79899 Other long term (current) drug therapy: Secondary | ICD-10-CM | POA: Diagnosis not present

## 2019-10-21 DIAGNOSIS — E039 Hypothyroidism, unspecified: Secondary | ICD-10-CM | POA: Diagnosis not present

## 2019-10-21 DIAGNOSIS — E1165 Type 2 diabetes mellitus with hyperglycemia: Secondary | ICD-10-CM | POA: Diagnosis not present

## 2019-10-21 DIAGNOSIS — E538 Deficiency of other specified B group vitamins: Secondary | ICD-10-CM | POA: Diagnosis not present

## 2019-11-26 ENCOUNTER — Ambulatory Visit: Payer: Medicare Other

## 2019-11-26 ENCOUNTER — Ambulatory Visit: Payer: Medicare Other | Attending: Internal Medicine

## 2019-11-26 DIAGNOSIS — Z23 Encounter for immunization: Secondary | ICD-10-CM

## 2019-11-26 NOTE — Progress Notes (Signed)
   Covid-19 Vaccination Clinic  Name:  Cheyenne Mccoy    MRN: [id number] DOB: [birth year]  11/26/2019  [name] was observed post Covid-19 immunization for 30 minutes  without incident. She was provided with Vaccine Information Sheet and instruction to access the V-Safe system.   [name] was instructed to call 911 with any severe reactions post vaccine: Marland Kitchen Difficulty breathing  . Swelling of face and throat  . A fast heartbeat  . A bad rash all over body  . Dizziness and weakness   Immunizations Administered    Name Date Dose VIS Date Route   Pfizer COVID-19 Vaccine 11/26/2019  9:46 AM 0.3 mL 08/22/2019 Intramuscular   Manufacturer: [employer], [employer]   Lot: [id number]   NDC: [address]-3

## 2019-11-27 DIAGNOSIS — H25012 Cortical age-related cataract, left eye: Secondary | ICD-10-CM | POA: Diagnosis not present

## 2019-11-28 ENCOUNTER — Ambulatory Visit: Payer: Medicare Other

## 2019-12-17 ENCOUNTER — Ambulatory Visit: Payer: Medicare Other | Attending: Internal Medicine

## 2019-12-17 DIAGNOSIS — Z23 Encounter for immunization: Secondary | ICD-10-CM

## 2019-12-17 NOTE — Progress Notes (Signed)
   Covid-19 Vaccination Clinic  Name:  JAELENE GARCIAGARCIA    MRN: [id number] DOB: [birth year]  12/17/2019  Ms. Sandin was observed post Covid-19 immunization for 30 minutes based on pre-vaccination screening without incident. She was provided with Vaccine Information Sheet and instruction to access the V-Safe system.   Ms. Ganus was instructed to call 911 with any severe reactions post vaccine: Marland Kitchen Difficulty breathing  . Swelling of face and throat  . A fast heartbeat  . A bad rash all over body  . Dizziness and weakness   Immunizations Administered    Name Date Dose VIS Date Route   Pfizer COVID-19 Vaccine 12/17/2019  9:25 AM 0.3 mL 08/22/2019 Intramuscular   Manufacturer: [employer], [employer]   Lot: [id number]   NDC: [address]-2

## 2019-12-18 DIAGNOSIS — E119 Type 2 diabetes mellitus without complications: Secondary | ICD-10-CM | POA: Diagnosis not present

## 2019-12-20 ENCOUNTER — Emergency Department
Admission: EM | Admit: 2019-12-20 | Discharge: 2019-12-23 | Disposition: A | Discharge status: Other Acute Inpatient Hospital | Payer: Medicare Other | Attending: Emergency Medicine | Admitting: Emergency Medicine

## 2019-12-20 DIAGNOSIS — Z20822 Contact with and (suspected) exposure to covid-19: Secondary | ICD-10-CM | POA: Diagnosis not present

## 2019-12-20 DIAGNOSIS — E039 Hypothyroidism, unspecified: Secondary | ICD-10-CM | POA: Insufficient documentation

## 2019-12-20 DIAGNOSIS — R454 Irritability and anger: Secondary | ICD-10-CM | POA: Insufficient documentation

## 2019-12-20 DIAGNOSIS — F430 Acute stress reaction: Secondary | ICD-10-CM

## 2019-12-20 DIAGNOSIS — I10 Essential (primary) hypertension: Secondary | ICD-10-CM | POA: Diagnosis not present

## 2019-12-20 DIAGNOSIS — Z7984 Long term (current) use of oral hypoglycemic drugs: Secondary | ICD-10-CM | POA: Insufficient documentation

## 2019-12-20 DIAGNOSIS — Z03818 Encounter for observation for suspected exposure to other biological agents ruled out: Secondary | ICD-10-CM | POA: Diagnosis not present

## 2019-12-20 DIAGNOSIS — R451 Restlessness and agitation: Secondary | ICD-10-CM | POA: Insufficient documentation

## 2019-12-20 DIAGNOSIS — E119 Type 2 diabetes mellitus without complications: Secondary | ICD-10-CM | POA: Insufficient documentation

## 2019-12-20 DIAGNOSIS — F3160 Bipolar disorder, current episode mixed, unspecified: Secondary | ICD-10-CM | POA: Diagnosis not present

## 2019-12-20 DIAGNOSIS — F319 Bipolar disorder, unspecified: Secondary | ICD-10-CM | POA: Diagnosis not present

## 2019-12-20 DIAGNOSIS — Z79899 Other long term (current) drug therapy: Secondary | ICD-10-CM | POA: Diagnosis not present

## 2019-12-20 DIAGNOSIS — F29 Unspecified psychosis not due to a substance or known physiological condition: Secondary | ICD-10-CM | POA: Diagnosis not present

## 2019-12-20 DIAGNOSIS — R456 Violent behavior: Secondary | ICD-10-CM | POA: Diagnosis not present

## 2019-12-20 DIAGNOSIS — F69 Unspecified disorder of adult personality and behavior: Secondary | ICD-10-CM | POA: Diagnosis not present

## 2019-12-20 DIAGNOSIS — F23 Brief psychotic disorder: Secondary | ICD-10-CM | POA: Insufficient documentation

## 2019-12-20 LAB — ACETAMINOPHEN LEVEL: Acetaminophen (Tylenol), Serum: <10 ug/mL — ABNORMAL LOW (ref 10–30)

## 2019-12-20 LAB — COMPREHENSIVE METABOLIC PANEL
ALT: 34 U/L (ref 0–44)
AST: 43 U/L — ABNORMAL HIGH (ref 15–41)
Albumin: 4.8 g/dL (ref 3.5–5.0)
Alkaline Phosphatase: 30 U/L — ABNORMAL LOW (ref 38–126)
Anion gap: 15 (ref 5–15)
BUN: 15 mg/dL (ref 6–20)
CO2: 17 mmol/L — ABNORMAL LOW (ref 22–32)
Calcium: 9.6 mg/dL (ref 8.9–10.3)
Chloride: 105 mmol/L (ref 98–111)
Creatinine, Ser: 0.97 mg/dL (ref 0.44–1.00)
GFR calc Af Amer: >60 mL/min (ref >60)
GFR calc non Af Amer: >60 mL/min (ref >60)
Glucose, Bld: 116 mg/dL — ABNORMAL HIGH (ref 70–99)
Potassium: 4.5 mmol/L (ref 3.5–5.1)
Sodium: 137 mmol/L (ref 135–145)
Total Bilirubin: 0.7 mg/dL (ref 0.3–1.2)
Total Protein: 8.5 g/dL — ABNORMAL HIGH (ref 6.5–8.1)

## 2019-12-20 LAB — RESPIRATORY PANEL BY RT PCR (FLU A&B, COVID)
Influenza A by PCR: NEGATIVE
Influenza B by PCR: NEGATIVE
SARS Coronavirus 2 by RT PCR: NEGATIVE

## 2019-12-20 LAB — CBC WITH DIFFERENTIAL/PLATELET
Abs Immature Granulocytes: 0.03 10*3/uL (ref 0.00–0.07)
Basophils Absolute: 0.1 10*3/uL (ref 0.0–0.1)
Basophils Relative: 1 %
Eosinophils Absolute: 0.1 10*3/uL (ref 0.0–0.5)
Eosinophils Relative: 1 %
HCT: 39.3 % (ref 36.0–46.0)
Hemoglobin: 12.4 g/dL (ref 12.0–15.0)
Immature Granulocytes: 0 %
Lymphocytes Relative: 41 %
Lymphs Abs: 3.3 10*3/uL (ref 0.7–4.0)
MCH: 27.4 pg (ref 26.0–34.0)
MCHC: 31.6 g/dL (ref 30.0–36.0)
MCV: 86.9 fL (ref 80.0–100.0)
Monocytes Absolute: 0.8 10*3/uL (ref 0.1–1.0)
Monocytes Relative: 10 %
Neutro Abs: 3.8 10*3/uL (ref 1.7–7.7)
Neutrophils Relative %: 47 %
Platelets: 313 10*3/uL (ref 150–400)
RBC: 4.52 MIL/uL (ref 3.87–5.11)
RDW: 14.8 % (ref 11.5–15.5)
WBC: 8.1 10*3/uL (ref 4.0–10.5)
nRBC: 0 % (ref 0.0–0.2)

## 2019-12-20 LAB — SALICYLATE LEVEL: Salicylate Lvl: <7 mg/dL — ABNORMAL LOW (ref 7.0–30.0)

## 2019-12-20 LAB — VALPROIC ACID LEVEL: Valproic Acid Lvl: <10 ug/mL — ABNORMAL LOW (ref 50.0–100.0)

## 2019-12-20 LAB — ETHANOL: Alcohol, Ethyl (B): <10 mg/dL (ref <10)

## 2019-12-20 LAB — T4, FREE: Free T4: 0.71 ng/dL (ref 0.61–1.12)

## 2019-12-20 LAB — TSH: TSH: 1.147 u[IU]/mL (ref 0.350–4.500)

## 2019-12-20 MED ORDER — HALOPERIDOL LACTATE 5 MG/ML IJ SOLN
5.0000 mg | Freq: Once | INTRAMUSCULAR | Status: AC
  Administered 2019-12-20: 14:00:00 5 mg via INTRAMUSCULAR

## 2019-12-20 MED ORDER — LORAZEPAM 2 MG/ML IJ SOLN
2.0000 mg | Freq: Four times a day (QID) | INTRAMUSCULAR | Status: DC | PRN
  Administered 2019-12-20: 18:00:00 2 mg via INTRAMUSCULAR
  Filled 2019-12-20: qty 1

## 2019-12-20 MED ORDER — LORAZEPAM 2 MG/ML IJ SOLN
2.0000 mg | Freq: Once | INTRAMUSCULAR | Status: AC
  Administered 2019-12-20: 2 mg via INTRAMUSCULAR

## 2019-12-20 MED ORDER — MIDAZOLAM HCL 2 MG/2ML IJ SOLN: 4.0000 mg | Freq: Once | INTRAMUSCULAR | Status: DC

## 2019-12-20 MED ORDER — HALOPERIDOL LACTATE 5 MG/ML IJ SOLN
10.0000 mg | Freq: Four times a day (QID) | INTRAMUSCULAR | Status: DC | PRN
  Administered 2019-12-20: 10 mg via INTRAMUSCULAR
  Filled 2019-12-20: qty 2

## 2019-12-20 MED ORDER — LORAZEPAM 2 MG PO TABS: 2.0000 mg | ORAL_TABLET | Freq: Four times a day (QID) | ORAL | Status: DC | PRN

## 2019-12-20 MED ORDER — DIPHENHYDRAMINE HCL 25 MG PO CAPS: 50.0000 mg | ORAL_CAPSULE | Freq: Four times a day (QID) | ORAL | Status: DC | PRN

## 2019-12-20 MED ORDER — DIPHENHYDRAMINE HCL 50 MG/ML IJ SOLN
50.0000 mg | Freq: Four times a day (QID) | INTRAMUSCULAR | Status: DC | PRN
  Administered 2019-12-20: 18:00:00 50 mg via INTRAMUSCULAR
  Filled 2019-12-20 (×2): qty 1

## 2019-12-20 MED ORDER — HALOPERIDOL 5 MG PO TABS: 5.0000 mg | ORAL_TABLET | Freq: Four times a day (QID) | ORAL | Status: DC | PRN

## 2019-12-20 MED ORDER — MIDAZOLAM HCL 2 MG/2ML IJ SOLN
2.0000 mg | Freq: Once | INTRAMUSCULAR | Status: DC
  Filled 2019-12-20: qty 2

## 2019-12-20 MED ORDER — MIDAZOLAM HCL 2 MG/2ML IJ SOLN
INTRAMUSCULAR | Status: AC
  Filled 2019-12-20: qty 2

## 2019-12-20 MED ORDER — MIDAZOLAM HCL 2 MG/2ML IJ SOLN
4.0000 mg | Freq: Once | INTRAMUSCULAR | Status: AC
  Administered 2019-12-20: 4 mg via INTRAMUSCULAR

## 2019-12-20 MED ORDER — DIPHENHYDRAMINE HCL 50 MG/ML IJ SOLN
25.0000 mg | Freq: Once | INTRAMUSCULAR | Status: AC
  Administered 2019-12-20: 25 mg via INTRAMUSCULAR

## 2019-12-20 NOTE — ED Notes (Signed)
Staff informed this RN that the pt was found on the floor, Cheyenne Mccoy ed tech saw patient attempting to walk around the back of the bed between the bed and roll up door which is a small space, bounce between the two areas and end up in the floor, pt laying on the floor where she says she wants to be and tells this RN, "fuck you, and fuck you and fuck you, leave me the hell alone and get out of here, you don't know what's going on"

## 2019-12-20 NOTE — ED Notes (Signed)
Patient observed lying in bed with eyes closed  Even, unlabored respirations observed   NAD pt appears to be sleeping  Monitoring will continue along with every 15 minute visual observations and ongoing security camera monitoring
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
## 2019-12-20 NOTE — ED Notes (Signed)
Pt. Up using bathroom, pt. Returned to room and went back to sleep.

## 2019-12-20 NOTE — ED Notes (Addendum)
Patient changed into blue scrub pants and maroon t-shirt, briefs and brown socks. Patient gave minimal help. Patient remained alert and will swear at staff and officers. Report given to Amy T RN.  Gray shorts Light green T-shirt Silver necklace with charms Watch

## 2019-12-20 NOTE — ED Notes (Signed)
This tech at pt bedside after pt is sitting herself in the floor, this tech explained to pt that we wont have that type of behavior on the unit and that pt needs to remain in the bed. Pt continues to throw herself in the floor. RN Amy T. Attempted to put mats on the floor but pt is refusing them and attempting to throw them at staff. Pt yelling at staff to "get the fuck out of my room"

## 2019-12-20 NOTE — ED Notes (Signed)
Pt asked for blanket and water. I told her I would tell the doctor she was ready to talk without cussing and being ugly and that she could have water after she talks with doctor.   lw edt
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
## 2019-12-20 NOTE — ED Notes (Signed)
This tech attempting for the 3rd time to place mats beside pt bed for pt safety, pt continues to pick mats up off the floor and throw them at this Clinical research associate. Pt is starting to spit at this staff member calling staff a "evil bitch" and slamming the door in staff face

## 2019-12-20 NOTE — ED Notes (Addendum)
Not able to obtain EKG due to patient's agitation. Will complete pysch assessment when patient is more cooperative.
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
## 2019-12-20 NOTE — ED Notes (Signed)
Pt transferred into ED BHU room 3    Patient assigned to appropriate care area. Patient oriented to unit/care area: Informed that, for her safety, care areas are designed for safety and monitored by security cameras at all times; Visiting hours and phone times explained to patient.  Assessment completed  She denies pain

## 2019-12-20 NOTE — ED Notes (Signed)
Pt ID bracelet placed on pt however pt began biting at it and got it off. She threw it on the floor.   ID bracelet is now on 20Hall bed with her scrubs she needs to change into.  lw edt
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
## 2019-12-20 NOTE — ED Notes (Signed)
Patient is alert, still swearing and rude, but agreed to have the Covid swab. Will transfer to [facility] when able.

## 2019-12-20 NOTE — ED Notes (Signed)
TTS unable to assess at this time.

## 2019-12-20 NOTE — ED Notes (Addendum)
Cheyenne Mccoy said she could have water and he would be over shortly to talk with her.  Drink spilt when she went to put on floor.   lw edt
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
## 2019-12-20 NOTE — ED Notes (Signed)
Attempted to take pt vitals she refused and began to curse at this tech.

## 2019-12-20 NOTE — ED Notes (Signed)
Patient was informed that she would need to remove her necklace. Patient refused repeatedly, telling this nurse to "fuck off, you whore." Patient eventually sat up on the mattress on the floor and took off the necklace and held it over her open mouth and had part of the necklace in her mouth when the security officer was able to retrieve it. Patient's silver necklace and watch were placed in specimen containers with the patient label on it and placed in the patient's belongings bag and secured in the BHU locked belongings room.

## 2019-12-20 NOTE — ED Notes (Signed)
Pt. Observed sleeping on bed, with even unlabored respirations.

## 2019-12-21 DIAGNOSIS — F3160 Bipolar disorder, current episode mixed, unspecified: Secondary | ICD-10-CM

## 2019-12-21 DIAGNOSIS — F319 Bipolar disorder, unspecified: Secondary | ICD-10-CM | POA: Diagnosis present

## 2019-12-21 MED ORDER — ROSUVASTATIN CALCIUM 10 MG PO TABS
10.0000 mg | ORAL_TABLET | Freq: Every day | ORAL | Status: DC
  Administered 2019-12-22 – 2019-12-23 (×2): 10 mg via ORAL
  Filled 2019-12-21 (×4): qty 1

## 2019-12-21 MED ORDER — METFORMIN HCL ER 750 MG PO TB24
2000.0000 mg | ORAL_TABLET | Freq: Every day | ORAL | Status: DC
  Filled 2019-12-21 (×4): qty 1

## 2019-12-21 MED ORDER — LOSARTAN POTASSIUM 50 MG PO TABS
100.0000 mg | ORAL_TABLET | Freq: Every day | ORAL | Status: DC
  Administered 2019-12-22 – 2019-12-23 (×2): 100 mg via ORAL
  Filled 2019-12-21 (×4): qty 2

## 2019-12-21 MED ORDER — QUETIAPINE FUMARATE 25 MG PO TABS
300.0000 mg | ORAL_TABLET | Freq: Every day | ORAL | Status: DC
  Administered 2019-12-21 – 2019-12-22 (×2): 300 mg via ORAL
  Filled 2019-12-21 (×2): qty 1

## 2019-12-21 MED ORDER — GLIPIZIDE ER 5 MG PO TB24
5.0000 mg | ORAL_TABLET | Freq: Every day | ORAL | Status: DC
  Administered 2019-12-22 – 2019-12-23 (×2): 5 mg via ORAL
  Filled 2019-12-21 (×4): qty 1

## 2019-12-21 MED ORDER — LEVOTHYROXINE SODIUM 25 MCG PO TABS
50.0000 ug | ORAL_TABLET | Freq: Every day | ORAL | Status: DC
  Administered 2019-12-23: 06:00:00 50 ug via ORAL
  Filled 2019-12-21: qty 2

## 2019-12-21 MED ORDER — CLONAZEPAM 1 MG PO TABS
1.0000 mg | ORAL_TABLET | Freq: Three times a day (TID) | ORAL | Status: DC | PRN
  Administered 2019-12-21 – 2019-12-22 (×2): 1 mg via ORAL
  Filled 2019-12-21 (×2): qty 1

## 2019-12-21 MED ORDER — METFORMIN HCL ER 750 MG PO TB24: 2000.0000 mg | ORAL_TABLET | Freq: Every day | ORAL | Status: DC

## 2019-12-21 NOTE — ED Provider Notes (Signed)
Emergency Medicine Observation Re-evaluation Note  Cheyenne Mccoy is a 54 y.o. female, seen on rounds today.  Pt initially presented to the ED for complaints of IVC Currently, the patient is resting in no acute distress.  Physical Exam  BP (!) 142/98   Pulse 98   Temp 97.8 F (36.6 C)   Resp 18   SpO2 100%  Physical Exam  ED Course / MDM  EKG:  Clinical Course as of Dec 20 625  Sat Dec 20, 2019  1441 Patient becoming increasingly violent towards staff despite receiving Haldol Benadryl and Versed.  Will give additional dose of Versed.  She did pull out her IV.  We will continue to monitor.   [PR]    Clinical Course User Index [PR] Willy Eddy, MD   I have reviewed the labs performed to date as well as medications administered while in observation.  No events overnight.   Plan  Current plan is for psychiatric disposition. Patient is under full IVC at this time.   Irean Hong, MD 12/21/19 [phone]

## 2019-12-21 NOTE — ED Notes (Addendum)
She has refused assessment - she has refused vital signs  She is IVC    She is awake - sitting up in bed  NAD observed   EKG still ordered from arrival yesterday  EKG still unable to obtain
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
## 2019-12-21 NOTE — ED Notes (Signed)
Pt. Woke up and began yelling.  This nurse entered patients room and asked "What is wrong".  Pt. States, "Where am I"  Pt. Told she was in [facility] hospital in [place].  Pt. Asked, "Why does no one care that I am cold.  Pt. Told additional blankets can be provided.  Pt. Given additional blanket.

## 2019-12-21 NOTE — BH Assessment (Signed)
Referral information for Psychiatric Hospitalization faxed to;   [facility] [phone]),   . [facility] [phone] -or- [phone]) [phone]  . [facility] ([phone]),  . [facility] [phone], [phone], [phone] or [phone]),   . [facility] [phone] or [phone])  . [facility] [phone]),   . [facility] [phone] -or- [phone]),   . [facility] [phone] or [phone])  . [place] [phone] or [phone]),   . [facility] [phone]).  [facility] [phone])

## 2019-12-21 NOTE — ED Notes (Signed)
She refuses to answer my questions - she refuses to allow VS to be taken  She refuses medications

## 2019-12-21 NOTE — ED Notes (Signed)
Rounding on the unit - opened her door  She popped up in bed and screamed "FUCK YOU"  I closed the door  She never got out of bed

## 2019-12-21 NOTE — ED Notes (Signed)
She has ambulated to the nurses station window - she is directing her loud demanding speech towards the security officers  "When I told you to knock before you come in my room - I fucking meant it - you better not come into my room ever again  - I threw that food on the fucking floor and I am not going to pick it up - I dare you to try and pick up the mess cause I am going to fuck up anybody that comes to that door"   She then stormed back to her room and slammed the door   She has moved the chair in her room in  front of the door   Door adjusted to open outward

## 2019-12-21 NOTE — ED Notes (Signed)
She has ambulated to the BR with a steady gait   She can be heard slamming the bathroom door x3

## 2019-12-21 NOTE — ED Notes (Signed)
Delivering meal trays - she can be heard crying in the hallway  Knocked ont he door and she instantly sat up and started screaming  "get out - get the fuck out - fuck you"  I tried to tell her that I have her breakfast for her  She got out of bed and screamed  "get the fuck out of here  FUCK YOU"   By now she is out of the bed  She threw her breakfast tray at me including a lemon lime drink   She then violently slammed the door  Opened it and again violently slammed the door

## 2019-12-21 NOTE — Consult Note (Signed)
[facility] Face-to-Face Psychiatry Consult   Reason for Consult:  Acute psychosis Referring Physician:  EDP Patient Identification: Cheyenne Mccoy MRN:  [id number] Principal Diagnosis: Bipolar disorder (manic depression) (HCC) Diagnosis:  Principal Problem:   Bipolar disorder (manic depression) (HCC)   Total Time spent with patient: 30 minutes  Subjective:   Cheyenne Mccoy is a 54 y.o. female patient refuses to answer questions. She is lying in bed making crying sounds and refusing to answer questions. Patient has been throwing herself out of the bed and onto the floor.   HPI:  Per EDP: 54 y.o. female the below listed past medical history presents to the ER with acute agitated psychosis under IVC brought in by EMS in police custody.  Patient violent threatening uncooperative with staff.  There was reportedly some sort of altercation at home today which prompted EMS and police to be called.  Patient became increasingly agitated she is uncooperative unable to provide any additional history.  She is just screaming incomprehensible sounds.  Patient is seen by this provider face-to-face.  She has been lying in the bed and was crying mildly.  However, patient was seen on security camera getting out of the bed and throwing her self on the floor and is not redirectable at that time.  Reviewed patient's chart and appears that patient has been diagnosed with bipolar disorder and was on several medications.  However this cannot be confirmed at the moment.  Patient per PMP D uses [facility] in [place] and the phone number is [phone] we will contact them once they open.  Patient is also reported to have her prescriptions prescribed to her by group and her car in [place].  At the moment patient appears to be experiencing mixed episodes of bipolar disorder and is unable to communicate logically.  Patient appears to be having thought blocking with extreme labile presentations.  At this  time would recommend for patient to be admitted for psychiatric treatment.  Patient is under IVC and I will remain.  TTS will be seeking placement for this patient.  Based off of the chart review patient was also taking Depakote and Seroquel and a valproic acid level revealed that her valproic acid level was less than 10 and appears the patient has not been taking her medications.  Past Psychiatric History: Chart review indicates bipolar disorder with numerous medications in the past.  Suspected numerous hospitalizations.  Risk to Self:   Risk to Others:   Prior Inpatient Therapy:   Prior Outpatient Therapy:    Past Medical History:  Past Medical History:  Diagnosis Date  . ADD (attention deficit disorder)   . ADHD (attention deficit hyperactivity disorder)   . Anxiety   . Bipolar disorder (HCC)   . Depression    bipolar   . Diabetes mellitus without complication (HCC)   . GERD (gastroesophageal reflux disease)    no current medications  . Hyperlipidemia   . Hypertension   . Hypothyroidism   . Insomnia   . Laceration of index finger    left, low pulse ox reading, gets cold  . Tuberculosis    history positive ppd's - chest xray neg     Past Surgical History:  Procedure Laterality Date  . BREAST SURGERY     left side - cyst removed benign  . CERVICAL POLYPECTOMY N/A 08/19/2013   Procedure: endometrial POLYPECTOMY;  Surgeon: Sharon Seller, DO;  Location: [facility];  Service: Gynecology;  Laterality: N/A;  . COLONOSCOPY    .  DILATATION & CURETTAGE/HYSTEROSCOPY WITH TRUECLEAR N/A 08/19/2013   Procedure: DILATATION & CURETTAGE/HYSTEROSCOPY WITH TRUCLEAR;  Surgeon: Sharon Seller, DO;  Location: [facility];  Service: Gynecology;  Laterality: N/A;  . DILATION AND CURETTAGE OF UTERUS    . DILITATION & CURRETTAGE/HYSTROSCOPY WITH NOVASURE ABLATION N/A 08/19/2013   Procedure: DILATATION & CURETTAGE/HYSTEROSCOPY WITH NOVASURE ABLATION;  Surgeon: Sharon Seller, DO;  Location: [facility];  Service:  Gynecology;  Laterality: N/A;  . NERVE REPAIR Left 07/04/2018   Procedure: REPAIR LEFT INDEX NERVE, AXOGUARD TUBE;  Surgeon: Cindee Salt, MD;  Location: [facility];  Service: Orthopedics;  Laterality: Left;  . PARATHYROIDECTOMY N/A 05/12/2019   Procedure: NECK EXPLORATION WITH PARATHYROIDECTOMY;  Surgeon: Darnell Level, MD;  Location: [facility];  Service: General;  Laterality: N/A;  . POLYPECTOMY    . WISDOM TOOTH EXTRACTION    . WOUND EXPLORATION Left 07/04/2018   Procedure: WOUND EXPLORATION;  Surgeon: Cindee Salt, MD;  Location: [facility];  Service: Orthopedics;  Laterality: Left;   Family History:  Family History  Problem Relation Age of Onset  . Depression Mother   . Drug abuse Brother   . Depression Maternal Uncle   . Depression Maternal Grandmother   . Breast cancer Neg Hx    Family Psychiatric  History: None reported Social History:  Social History   Substance and Sexual Activity  Alcohol Use Not Currently   Comment: occasional     Social History   Substance and Sexual Activity  Drug Use No    Social History   Socioeconomic History  . Marital status: Divorced    Spouse name: Not on file  . Number of children: 0  . Years of education: Not on file  . Highest education level: Bachelor's degree (e.g., BA, AB, BS)  Occupational History    Comment: disablitity   Tobacco Use  . Smoking status: Former Smoker    Packs/day: 0.50    Years: 20.00    Pack years: 10.00    Types: Cigarettes    Quit date: 03/11/2013    Years since quitting: 6.7  . Smokeless tobacco: Never Used  Substance and Sexual Activity  . Alcohol use: Not Currently    Comment: occasional  . Drug use: No  . Sexual activity: Not Currently    Birth control/protection: None, Surgical    Comment: ablation 2014  Other Topics Concern  . Not on file  Social History Narrative  . Not on file   Social Determinants of Health   Financial Resource Strain:   . Difficulty of  Paying Living Expenses:   Food Insecurity:   . Worried About Programme researcher, broadcasting/film/video in the Last Year:   . Barista in the Last Year:   Transportation Needs:   . Freight forwarder (Medical):   Marland Kitchen Lack of Transportation (Non-Medical):   Physical Activity:   . Days of Exercise per Week:   . Minutes of Exercise per Session:   Stress:   . Feeling of Stress :   Social Connections:   . Frequency of Communication with Friends and Family:   . Frequency of Social Gatherings with Friends and Family:   . Attends Religious Services:   . Active Member of Clubs or Organizations:   . Attends Banker Meetings:   Marland Kitchen Marital Status:    Additional Social History:    Allergies:   Allergies  Allergen Reactions  . Bee Venom Anaphylaxis  . Codeine Anaphylaxis and  Hives  . Penicillins Anaphylaxis and Hives    Did it involve swelling of the face/tongue/throat, SOB, or low BP? Yes Did it involve sudden or severe rash/hives, skin peeling, or any reaction on the inside of your mouth or nose? No Did you need to seek medical attention at a hospital or doctor's office? Yes When did it last happen?Childhood allergy If all above answers are "NO", may proceed with cephalosporin use.     Labs:  Results for orders placed or performed during the hospital encounter of 12/20/19 (from the past 48 hour(s))  CBC with Differential/Platelet     Status: None   Collection Time: 12/20/19  2:28 PM  Result Value Ref Range   WBC 8.1 4.0 - 10.5 K/uL   RBC 4.52 3.87 - 5.11 MIL/uL   Hemoglobin 12.4 12.0 - 15.0 g/dL   HCT 50.2 77.4 - 12.8 %   MCV 86.9 80.0 - 100.0 fL   MCH 27.4 26.0 - 34.0 pg   MCHC 31.6 30.0 - 36.0 g/dL   RDW 78.6 76.7 - 20.9 %   Platelets 313 150 - 400 K/uL   nRBC 0.0 0.0 - 0.2 %   Neutrophils Relative % 47 %   Neutro Abs 3.8 1.7 - 7.7 K/uL   Lymphocytes Relative 41 %   Lymphs Abs 3.3 0.7 - 4.0 K/uL   Monocytes Relative 10 %   Monocytes Absolute 0.8 0.1 - 1.0 K/uL    Eosinophils Relative 1 %   Eosinophils Absolute 0.1 0.0 - 0.5 K/uL   Basophils Relative 1 %   Basophils Absolute 0.1 0.0 - 0.1 K/uL   Immature Granulocytes 0 %   Abs Immature Granulocytes 0.03 0.00 - 0.07 K/uL    Comment: Performed at [facility], [address] [place], [place] [address]  Comprehensive metabolic panel     Status: Abnormal   Collection Time: 12/20/19  2:28 PM  Result Value Ref Range   Sodium 137 135 - 145 mmol/L   Potassium 4.5 3.5 - 5.1 mmol/L   Chloride 105 98 - 111 mmol/L   CO2 17 (L) 22 - 32 mmol/L   Glucose, Bld 116 (H) 70 - 99 mg/dL    Comment: Glucose reference range applies only to samples taken after fasting for at least 8 hours.   BUN 15 6 - 20 mg/dL   Creatinine, Ser 2.83 0.44 - 1.00 mg/dL   Calcium 9.6 8.9 - 66.2 mg/dL   Total Protein 8.5 (H) 6.5 - 8.1 g/dL   Albumin 4.8 3.5 - 5.0 g/dL   AST 43 (H) 15 - 41 U/L   ALT 34 0 - 44 U/L   Alkaline Phosphatase 30 (L) 38 - 126 U/L   Total Bilirubin 0.7 0.3 - 1.2 mg/dL   GFR calc non Af Amer >60 >60 mL/min   GFR calc Af Amer >60 >60 mL/min   Anion gap 15 5 - 15    Comment: Performed at [facility], [address] [place], [place] [address]  TSH     Status: None   Collection Time: 12/20/19  2:28 PM  Result Value Ref Range   TSH 1.147 0.350 - 4.500 uIU/mL    Comment: Performed by a 3rd Generation assay with a functional sensitivity of <=0.01 uIU/mL. Performed at [facility], [address] [place], [place] [address]   T4, free     Status: None   Collection Time: 12/20/19  2:28 PM  Result Value Ref Range   Free  T4 0.71 0.61 - 1.12 ng/dL    Comment: (NOTE) Biotin ingestion may interfere with free T4 tests. If the results are inconsistent with the TSH level, previous test results, or the clinical presentation, then consider biotin interference. If needed, order repeat testing after stopping biotin. Performed at [facility], [address] [place], [place]  [address]   Ethanol     Status: None   Collection Time: 12/20/19  2:28 PM  Result Value Ref Range   Alcohol, Ethyl (B) <10 <10 mg/dL    Comment: (NOTE) Lowest detectable limit for serum alcohol is 10 mg/dL. For medical purposes only. Performed at [facility], [address] [place], [place] [address]   Acetaminophen level     Status: Abnormal   Collection Time: 12/20/19  2:28 PM  Result Value Ref Range   Acetaminophen (Tylenol), Serum <10 (L) 10 - 30 ug/mL    Comment: (NOTE) Therapeutic concentrations vary significantly. A range of 10-30 ug/mL  may be an effective concentration for many patients. However, some  are best treated at concentrations outside of this range. Acetaminophen concentrations >150 ug/mL at 4 hours after ingestion  and >50 ug/mL at 12 hours after ingestion are often associated with  toxic reactions. Performed at [facility], [address] [place], [place] [address]   Salicylate level     Status: Abnormal   Collection Time: 12/20/19  2:28 PM  Result Value Ref Range   Salicylate Lvl <7.0 (L) 7.0 - 30.0 mg/dL    Comment: Performed at [facility], [address] [place], [place] [address]  Valproic acid level     Status: Abnormal   Collection Time: 12/20/19  2:28 PM  Result Value Ref Range   Valproic Acid Lvl <10 (L) 50.0 - 100.0 ug/mL    Comment: Performed at [facility], [address] [place], [place] [address]  Respiratory Panel by RT PCR (Flu A&B, Covid) - Nasopharyngeal Swab     Status: None   Collection Time: 12/20/19  4:09 PM   Specimen: Nasopharyngeal Swab  Result Value Ref Range   SARS Coronavirus 2 by RT PCR NEGATIVE NEGATIVE    Comment: (NOTE) SARS-CoV-2 target nucleic acids are NOT DETECTED. The SARS-CoV-2 RNA is generally detectable in upper respiratoy specimens during the acute phase of infection. The lowest concentration of SARS-CoV-2 viral copies this assay can detect is 131 copies/mL. A  negative result does not preclude SARS-Cov-2 infection and should not be used as the sole basis for treatment or other patient management decisions. A negative result may occur with  improper specimen collection/handling, submission of specimen other than nasopharyngeal swab, presence of viral mutation(s) within the areas targeted by this assay, and inadequate number of viral copies (<131 copies/mL). A negative result must be combined with clinical observations, patient history, and epidemiological information. The expected result is Negative. Fact Sheet for Patients:  https://www.moore.com/https://www.fda.gov/media/142436/download Fact Sheet for Healthcare Providers:  https://www.young.biz/https://www.fda.gov/media/142435/download This test is not yet ap proved or cleared by the [place] FDA and  has been authorized for detection and/or diagnosis of SARS-CoV-2 by FDA under an Emergency Use Authorization (EUA). This EUA will remain  in effect (meaning this test can be used) for the duration of the COVID-19 declaration under Section 564(b)(1) of the Act, 21 U.S.C. section 360bbb-3(b)(1), unless the authorization is terminated or revoked sooner.    Influenza A by PCR NEGATIVE NEGATIVE   Influenza B by PCR NEGATIVE NEGATIVE    Comment: (NOTE) The  Xpert Xpress SARS-CoV-2/FLU/RSV assay is intended as an aid in  the diagnosis of influenza from Nasopharyngeal swab specimens and  should not be used as a sole basis for treatment. Nasal washings and  aspirates are unacceptable for Xpert Xpress SARS-CoV-2/FLU/RSV  testing. Fact Sheet for Patients: https://www.moore.com/ Fact Sheet for Healthcare Providers: https://www.young.biz/ This test is not yet approved or cleared by the [place] FDA and  has been authorized for detection and/or diagnosis of SARS-CoV-2 by  FDA under an Emergency Use Authorization (EUA). This EUA will remain  in effect (meaning this test can be used) for the duration  of the  Covid-19 declaration under Section 564(b)(1) of the Act, 21  U.S.C. section 360bbb-3(b)(1), unless the authorization is  terminated or revoked. Performed at [facility], [address], [place], [place] [address]     Current Facility-Administered Medications  Medication Dose Route Frequency Provider Last Rate Last Admin  . diphenhydrAMINE (BENADRYL) capsule 50 mg  50 mg Oral Q6H PRN Shamona Wirtz, Gerlene Burdock, FNP       Or  . diphenhydrAMINE (BENADRYL) injection 50 mg  50 mg Intramuscular Q6H PRN Lathon Adan, Feliz Beam B, FNP   50 mg at 12/20/19 1740  . haloperidol (HALDOL) tablet 5 mg  5 mg Oral Q6H PRN Khadeejah Castner, Gerlene Burdock, FNP       Or  . haloperidol lactate (HALDOL) injection 10 mg  10 mg Intramuscular Q6H PRN Jaceion Aday, Gerlene Burdock, FNP   10 mg at 12/20/19 1740  . LORazepam (ATIVAN) tablet 2 mg  2 mg Oral Q6H PRN Maston Wight, Gerlene Burdock, FNP       Or  . LORazepam (ATIVAN) injection 2 mg  2 mg Intramuscular Q6H PRN Fenris Cauble, Gerlene Burdock, FNP   2 mg at 12/20/19 1740   Current Outpatient Medications  Medication Sig Dispense Refill  . acetaminophen (TYLENOL) 325 MG tablet Take 650 mg by mouth every 6 (six) hours as needed for moderate pain.    Marland Kitchen ALPRAZolam (XANAX) 1 MG tablet Take 1-2 mg by mouth 4 (four) times daily as needed for anxiety.     Marland Kitchen amLODipine (NORVASC) 5 MG tablet Take 5 mg by mouth daily.    Marland Kitchen amphetamine-dextroamphetamine (ADDERALL XR) 30 MG 24 hr capsule Take 30 mg by mouth daily as needed (ADHD).     . chlorthalidone (HYGROTON) 25 MG tablet Take 12.5 mg by mouth every morning.    . Choline Fenofibrate (TRILIPIX) 135 MG capsule Take 135 mg by mouth daily.    . cyanocobalamin (,VITAMIN B-12,) 1000 MCG/ML injection Inject 1,000 mcg into the muscle every 30 (thirty) days.    . divalproex (DEPAKOTE ER) 500 MG 24 hr tablet Take 1,000 mg by mouth at bedtime.     Marland Kitchen glipiZIDE (GLUCOTROL XL) 5 MG 24 hr tablet Take 5 mg by mouth daily with breakfast.   0  . levothyroxine (SYNTHROID, LEVOTHROID) 50 MCG  tablet Take 50 mcg by mouth daily before breakfast.   0  . losartan (COZAAR) 100 MG tablet Take 100 mg by mouth daily.    . metFORMIN (GLUCOPHAGE-XR) 500 MG 24 hr tablet Take 2,000 mg by mouth at bedtime.   0  . Omega-3 Fatty Acids (FISH OIL) 1200 MG CAPS Take 2,400 mg by mouth at bedtime.     Marland Kitchen QUEtiapine (SEROQUEL XR) 300 MG 24 hr tablet Take 300 mg by mouth at bedtime.    Marland Kitchen QUEtiapine Fumarate (SEROQUEL XR) 150 MG 24 hr tablet Take 150 mg by mouth at bedtime.    Marland Kitchen  rosuvastatin (CRESTOR) 10 MG tablet Take 10 mg by mouth daily.    . traMADol (ULTRAM) 50 MG tablet Take 1 tablet (50 mg total) by mouth every 6 (six) hours as needed. (Patient not taking: Reported on 05/02/2019) 20 tablet 0  . traMADol (ULTRAM) 50 MG tablet Take 1-2 tablets (50-100 mg total) by mouth every 6 (six) hours as needed. 15 tablet 0  . Vitamin D, Ergocalciferol, (DRISDOL) 50000 units CAPS capsule Take 50,000 Units by mouth 2 (two) times a week.   0  . zolpidem (AMBIEN CR) 12.5 MG CR tablet Take 12.5 mg by mouth at bedtime as needed for sleep.   3    Musculoskeletal: Strength & Muscle Tone: within normal limits Gait & Station: normal Patient leans: N/A  Psychiatric Specialty Exam: Physical Exam  Nursing note and vitals reviewed. Constitutional: She appears well-developed and well-nourished.  Cardiovascular: Normal rate.  Respiratory: Effort normal.  Musculoskeletal:        General: Normal range of motion.  Neurological: She is alert.  Psychiatric: Her affect is labile and inappropriate. She is agitated and withdrawn. Cognition and memory are impaired. She expresses impulsivity and inappropriate judgment.    Review of Systems  Unable to perform ROS: Psychiatric disorder    Blood pressure (!) 142/98, pulse 98, temperature 97.8 F (36.6 C), resp. rate 18, SpO2 100 %.There is no height or weight on file to calculate BMI.  General Appearance: Disheveled  Eye Contact:  None  Speech:  Blocked  Volume:  Crying loudly   Mood:  Dysphoric and Irritable  Affect:  Labile  Thought Process:  Disorganized  Orientation:  Other:  refused to answer  Thought Content:  Not answering questions  Suicidal Thoughts:  Not answering questions  Homicidal Thoughts:  Not answering questions  Memory:  Negative  Judgement:  Impaired  Insight:  Lacking  Psychomotor Activity:  Increased  Concentration:  Concentration: Poor  Recall:  Poor  Fund of Knowledge:  Poor  Language:  Poor  Akathisia:  No  Handed:  Right  AIMS (if indicated):     Assets:  Social Support  ADL's:  Impaired  Cognition:  Impaired,  Mild  Sleep:        Treatment Plan Summary: Medication management  Disposition: Recommend psychiatric Inpatient admission when medically cleared.  [place], FNP 12/21/2019 10:17 AM

## 2019-12-21 NOTE — Consult Note (Signed)
Contacted [facility] to confirm medications.  Per the report patient is on glipizide 5 mg p.o. daily, Seroquel XR 300 mg p.o. nightly but did not pick this medication up, Adderall 20 mg every morning and at noon, losartan 100 mg p.o. daily, Crestor 10 mg p.o. daily, metformin XR 2000 mg with evening meal, Synthroid 50 mcg, vitamin D2 50,000 units twice weekly, Xanax 1 mg 4 times a day and Ambien ER 12.5 mg p.o. nightly.  The Ambien and Xanax was picked up 47 days ago.  They stated that the patient did switch to [facility] at 1 time and was on Depakote ER 500 mg to take 6 tablets every evening however this was not transferred back to them and they were informed that the patient had discontinued her Depakote in the past which would indicate why her valproic acid level was less than 10.  See active orders for medications that were restarted

## 2019-12-22 LAB — GLUCOSE, CAPILLARY: Glucose-Capillary: 100 mg/dL — ABNORMAL HIGH (ref 70–99)

## 2019-12-22 MED ORDER — ACETAMINOPHEN 325 MG PO TABS
650.0000 mg | ORAL_TABLET | Freq: Four times a day (QID) | ORAL | Status: DC | PRN
  Administered 2019-12-22 – 2019-12-23 (×2): 650 mg via ORAL
  Filled 2019-12-22 (×3): qty 2

## 2019-12-22 MED ORDER — ONDANSETRON HCL 4 MG PO TABS
4.0000 mg | ORAL_TABLET | Freq: Three times a day (TID) | ORAL | Status: DC | PRN
  Administered 2019-12-22: 4 mg via ORAL
  Filled 2019-12-22: qty 1

## 2019-12-22 NOTE — ED Notes (Signed)
Patient refused breakfast but left  tray in room if patient changes her mind.,will check again later

## 2019-12-22 NOTE — BHH Counselor (Addendum)
Patient is to be admitted to [facility] by Dr. Toni Amend and [facility].  Attending Physician will be. Reola Calkins, NP.   Patient has been assigned to room 303, by [facility] Charge Nurse GIGI, RN.   Intake Paper Work has been signed and placed on patient chart.  ER staff is aware of the admission:   Dr. Minna Antis, ER MD   Tammy Sours Patient's Nurse   [facility] Patient Access.

## 2019-12-22 NOTE — ED Notes (Signed)
Pt refused to have vital signs taken and refused her evening snack. Pt is calm at this time.

## 2019-12-22 NOTE — ED Notes (Signed)
Pt agitated with staff performing 15 minute checks.  "Don't be looking in my window or coming in my room."  RN and NT explained to patient these checks are required.  "Well, then Im going to start being a bitch."

## 2019-12-22 NOTE — ED Notes (Signed)
Pt refused VS  

## 2019-12-22 NOTE — ED Provider Notes (Signed)
Emergency Medicine Observation Re-evaluation Note  Cheyenne Mccoy is a 54 y.o. female, seen on rounds today.  Pt initially presented to the ED for complaints of IVC Currently, the patient is sleeping.  Physical Exam  BP (!) 142/98   Pulse 98   Temp 97.8 F (36.6 C)   Resp 18   SpO2 100%  Physical Exam  ED Course / MDM  EKG:  I have reviewed the labs performed to date as well as medications administered while in observation.  Recent changes in the last 24 hours include none. Plan  Current plan is for psychiatric disposition. Patient is under full IVC at this time.   Don Perking, [place], MD 12/22/19 [phone]

## 2019-12-22 NOTE — ED Notes (Signed)
Patient refused lunch

## 2019-12-22 NOTE — ED Notes (Signed)
Pt approached the door asking for tylenol for a headache and something for nausea. EDP notified.

## 2019-12-22 NOTE — Progress Notes (Signed)
[facility] MD Progress Note  12/22/2019 5:02 PM Cheyenne Mccoy  MRN:  [id number] Subjective: Fearful of staff and food and agitated. Mixed mood symptoms. Principal Problem: Bipolar disorder (manic depression) (HCC) Diagnosis: Principal Problem:   Bipolar disorder (manic depression) (HCC)  Total Time spent with patient: 15 minutes  Per Admission Subjective:   Cheyenne Mccoy is a 54 y.o. female patient refuses to answer questions. She is lying in bed making crying sounds and refusing to answer questions. Patient has been throwing herself out of the bed and onto the floor.   HPI:  Per EDP: 54 y.o.femalethe below listed past medical history presents to the ER with acute agitated psychosis under IVC brought in by EMS in police custody. Patient violent threatening uncooperative with staff. There was reportedly some sort of altercation at home today which prompted EMS and police to be called. Patient became increasingly agitated she is uncooperative unable to provide any additional history. She is just screaming incomprehensible sounds.  Patient is seen by this provider face-to-face.  She has been lying in the bed and was crying mildly.  However, patient was seen on security camera getting out of the bed and throwing her self on the floor and is not redirectable at that time.  Reviewed patient's chart and appears that patient has been diagnosed with bipolar disorder and was on several medications.  However this cannot be confirmed at the moment.  Patient per PMP D uses [facility] in [place] and the phone number is [phone] we will contact them once they open.  Patient is also reported to have her prescriptions prescribed to her by group and her car in [place].  At the moment patient appears to be experiencing mixed episodes of bipolar disorder and is unable to communicate logically.  Patient appears to be having thought blocking with extreme labile presentations.  At this  time would recommend for patient to be admitted for psychiatric treatment.  Patient is under IVC and I will remain.  TTS will be seeking placement for this patient.  Based off of the chart review patient was also taking Depakote and Seroquel and a valproic acid level revealed that her valproic acid level was less than 10 and appears the patient has not been taking her medications.  Past Psychiatric History: Chart review indicates bipolar disorder with numerous medications in the past.  Suspected numerous hospitalizations  Past Medical History:  Past Medical History:  Diagnosis Date  . ADD (attention deficit disorder)   . ADHD (attention deficit hyperactivity disorder)   . Anxiety   . Bipolar disorder (HCC)   . Depression    bipolar   . Diabetes mellitus without complication (HCC)   . GERD (gastroesophageal reflux disease)    no current medications  . Hyperlipidemia   . Hypertension   . Hypothyroidism   . Insomnia   . Laceration of index finger    left, low pulse ox reading, gets cold  . Tuberculosis    history positive ppd's - chest xray neg     Past Surgical History:  Procedure Laterality Date  . BREAST SURGERY     left side - cyst removed benign  . CERVICAL POLYPECTOMY N/A 08/19/2013   Procedure: endometrial POLYPECTOMY;  Surgeon: Sharon Seller, DO;  Location: [facility];  Service: Gynecology;  Laterality: N/A;  . COLONOSCOPY    . DILATATION & CURETTAGE/HYSTEROSCOPY WITH TRUECLEAR N/A 08/19/2013   Procedure: DILATATION & CURETTAGE/HYSTEROSCOPY WITH TRUCLEAR;  Surgeon: Sharon Seller, DO;  Location: [facility];  Service: Gynecology;  Laterality: N/A;  . DILATION AND CURETTAGE OF UTERUS    . DILITATION & CURRETTAGE/HYSTROSCOPY WITH NOVASURE ABLATION N/A 08/19/2013   Procedure: DILATATION & CURETTAGE/HYSTEROSCOPY WITH NOVASURE ABLATION;  Surgeon: Annalee Genta, DO;  Location: [facility];  Service: Gynecology;  Laterality: N/A;  . NERVE REPAIR Left 07/04/2018   Procedure: REPAIR LEFT INDEX  NERVE, AXOGUARD TUBE;  Surgeon: [name], MD;  Location: [facility];  Service: Orthopedics;  Laterality: Left;  . PARATHYROIDECTOMY N/A 05/12/2019   Procedure: NECK EXPLORATION WITH PARATHYROIDECTOMY;  Surgeon: Armandina Gemma, MD;  Location: [facility];  Service: General;  Laterality: N/A;  . POLYPECTOMY    . WISDOM TOOTH EXTRACTION    . WOUND EXPLORATION Left 07/04/2018   Procedure: WOUND EXPLORATION;  Surgeon: [name], MD;  Location: [facility];  Service: Orthopedics;  Laterality: Left;   Family History:  Family History  Problem Relation Age of Onset  . Depression Mother   . Drug abuse Brother   . Depression Maternal Uncle   . Depression Maternal Grandmother   . Breast cancer Neg Hx    Family Psychiatric  History: Social History:  Social History   Substance and Sexual Activity  Alcohol Use Not Currently   Comment: occasional     Social History   Substance and Sexual Activity  Drug Use No    Social History   Socioeconomic History  . Marital status: Divorced    Spouse name: Not on file  . Number of children: 0  . Years of education: Not on file  . Highest education level: Bachelor's degree (e.g., BA, AB, BS)  Occupational History    Comment: disablitity   Tobacco Use  . Smoking status: Former Smoker    Packs/day: 0.50    Years: 20.00    Pack years: 10.00    Types: Cigarettes    Quit date: 03/11/2013    Years since quitting: 6.7  . Smokeless tobacco: Never Used  Substance and Sexual Activity  . Alcohol use: Not Currently    Comment: occasional  . Drug use: No  . Sexual activity: Not Currently    Birth control/protection: None, Surgical    Comment: ablation 2014  Other Topics Concern  . Not on file  Social History Narrative  . Not on file   Social Determinants of Health   Financial Resource Strain:   . Difficulty of Paying Living Expenses:   Food Insecurity:   . Worried About Charity fundraiser in the Last Year:   . Youth worker in the Last Year:   Transportation Needs:   . Film/video editor (Medical):   Marland Kitchen Lack of Transportation (Non-Medical):   Physical Activity:   . Days of Exercise per Week:   . Minutes of Exercise per Session:   Stress:   . Feeling of Stress :   Social Connections:   . Frequency of Communication with Friends and Family:   . Frequency of Social Gatherings with Friends and Family:   . Attends Religious Services:   . Active Member of Clubs or Organizations:   . Attends Archivist Meetings:   Marland Kitchen Marital Status:    Additional Social History:    Pain Medications: See PTA Prescriptions: See PTA Over the Counter: See PTA History of alcohol / drug use?: No history of alcohol / drug abuse Longest period of sobriety (when/how long): n/a  Sleep: Fair  Appetite:  Good  Current Medications: Current Facility-Administered Medications  Medication Dose Route Frequency Provider Last Rate Last Admin  . clonazePAM (KLONOPIN) tablet 1 mg  1 mg Oral TID PRN Money, Gerlene Burdock, FNP   1 mg at 12/21/19 2151  . diphenhydrAMINE (BENADRYL) capsule 50 mg  50 mg Oral Q6H PRN Money, Feliz Beam B, FNP       Or  . diphenhydrAMINE (BENADRYL) injection 50 mg  50 mg Intramuscular Q6H PRN Money, Feliz Beam B, FNP   50 mg at 12/20/19 1740  . glipiZIDE (GLUCOTROL XL) 24 hr tablet 5 mg  5 mg Oral Q breakfast Money, Gerlene Burdock, FNP   5 mg at 12/22/19 1610  . haloperidol (HALDOL) tablet 5 mg  5 mg Oral Q6H PRN Money, Gerlene Burdock, FNP       Or  . haloperidol lactate (HALDOL) injection 10 mg  10 mg Intramuscular Q6H PRN Money, Gerlene Burdock, FNP   10 mg at 12/20/19 1740  . levothyroxine (SYNTHROID) tablet 50 mcg  50 mcg Oral Q0600 Money, Gerlene Burdock, FNP      . LORazepam (ATIVAN) tablet 2 mg  2 mg Oral Q6H PRN Money, Gerlene Burdock, FNP       Or  . LORazepam (ATIVAN) injection 2 mg  2 mg Intramuscular Q6H PRN Money, Gerlene Burdock, FNP   2 mg at 12/20/19 1740  . losartan (COZAAR) tablet 100 mg  100 mg Oral  Daily Money, Gerlene Burdock, FNP   100 mg at 12/22/19 0904  . metFORMIN (GLUCOPHAGE-XR) 24 hr tablet 2,000 mg  2,000 mg Oral Q supper Money, Gerlene Burdock, FNP      . QUEtiapine (SEROQUEL) tablet 300 mg  300 mg Oral QHS Money, Gerlene Burdock, FNP   300 mg at 12/21/19 2151  . rosuvastatin (CRESTOR) tablet 10 mg  10 mg Oral Daily Money, Gerlene Burdock, FNP   10 mg at 12/22/19 9604   Current Outpatient Medications  Medication Sig Dispense Refill  . acetaminophen (TYLENOL) 325 MG tablet Take 650 mg by mouth every 6 (six) hours as needed for moderate pain.    Marland Kitchen amphetamine-dextroamphetamine (ADDERALL XR) 30 MG 24 hr capsule Take 30 mg by mouth daily as needed (ADHD).     Marland Kitchen glipiZIDE (GLUCOTROL XL) 5 MG 24 hr tablet Take 5 mg by mouth daily with breakfast.   0  . levothyroxine (SYNTHROID, LEVOTHROID) 50 MCG tablet Take 50 mcg by mouth daily before breakfast.   0  . losartan (COZAAR) 100 MG tablet Take 100 mg by mouth daily.    . metFORMIN (GLUCOPHAGE-XR) 500 MG 24 hr tablet Take 2,000 mg by mouth at bedtime.   0  . rosuvastatin (CRESTOR) 10 MG tablet Take 10 mg by mouth daily.      Lab Results:  Results for orders placed or performed during the hospital encounter of 12/20/19 (from the past 48 hour(s))  Glucose, capillary     Status: Abnormal   Collection Time: 12/22/19  4:54 PM  Result Value Ref Range   Glucose-Capillary 100 (H) 70 - 99 mg/dL    Comment: Glucose reference range applies only to samples taken after fasting for at least 8 hours.   Comment 1 Notify RN     Blood Alcohol level:  Lab Results  Component Value Date   ETH <10 12/20/2019    Metabolic Disorder Labs: Lab Results  Component Value Date   HGBA1C 5.9 (H) 05/08/2019   MPG 122.63 05/08/2019   No results  found for: PROLACTIN No results found for: CHOL, TRIG, HDL, CHOLHDL, VLDL, LDLCALC  Physical Findings: AIMS:  , ,  ,  ,    CIWA:    COWS:     Musculoskeletal: Strength & Muscle Tone: within normal limits Gait & Station: unable to  stand Patient leans: N/A  Psychiatric Specialty Exam: Physical Exam  Review of Systems  Blood pressure (!) 142/98, pulse 98, temperature 97.8 F (36.6 C), resp. rate 18, SpO2 100 %.There is no height or weight on file to calculate BMI.  General Appearance: Disheveled  Eye Contact:  Good  Speech:  Normal Rate  Volume:  Normal  Mood:  Irritable  Affect:  Labile  Thought Process:  Coherent  Orientation:  Full (Time, Place, and Person)  Thought Content:  Paranoid Ideation  Suicidal Thoughts:  No  Homicidal Thoughts:  No  Memory:  NA  Judgement:  Impaired  Insight:  Lacking  Psychomotor Activity:  Increased  Concentration:  Concentration: Good  Recall:  NA  Fund of Knowledge:  NA  Language:  Good  Akathisia:  No  Handed:  Ambidextrous  AIMS (if indicated):     Assets:  Others:  unknown  ADL's:  Intact  Cognition:  Impaired,  Mild  Sleep:        Treatment Plan Summary: Daily contact with patient to assess and evaluate symptoms and progress in treatment  Recommend transfer to inpatient psych unit. Pt refused but this is her best chance for full treatment.  Cindy Hazy, MD 12/22/2019, 5:02 PM

## 2019-12-22 NOTE — ED Notes (Signed)
Comb given to patient and retrieved by Engineer, materials after use.

## 2019-12-22 NOTE — ED Notes (Signed)
Pt to restroom. Given water, toothbrush and toothpaste.

## 2019-12-22 NOTE — ED Notes (Signed)
Pt walking the unit.

## 2019-12-22 NOTE — ED Notes (Signed)
IVC/Consult completed/ Recommend Inpatient Admit

## 2019-12-22 NOTE — ED Notes (Signed)
Pt refusing her dinner tray.  Only wanted ice water.  Pt agitated and stated she has eye surgery April 15th. Pt stated, "If you are going to keep me I  MUST be taken to my appointment. I won't miss it."

## 2019-12-22 NOTE — ED Notes (Signed)
RN called dietary to have pt's meals modified. No carbs, only meat and vegetables.   Pt stated, "Dont bother getting it. I won't eat it. You are just going to poison it."   RN told pt this was not true. Pt mocked Charity fundraiser.   Pt's room is covered with food and drinks spilled on the floor. Pt refuses to clean room or have staff clean her room.

## 2019-12-23 ENCOUNTER — Inpatient Hospital Stay
Admission: AD | Admit: 2019-12-23 | Discharge: 2019-12-24 | DRG: 885 | Disposition: A | Discharge status: Home / Self Care | Payer: 59 | Source: Intra-hospital | Attending: Psychiatry | Admitting: Psychiatry

## 2019-12-23 ENCOUNTER — Encounter: Payer: Self-pay | Admitting: Psychiatry

## 2019-12-23 ENCOUNTER — Other Ambulatory Visit: Payer: Self-pay

## 2019-12-23 DIAGNOSIS — E785 Hyperlipidemia, unspecified: Secondary | ICD-10-CM | POA: Diagnosis not present

## 2019-12-23 DIAGNOSIS — Z813 Family history of other psychoactive substance abuse and dependence: Secondary | ICD-10-CM | POA: Diagnosis not present

## 2019-12-23 DIAGNOSIS — F909 Attention-deficit hyperactivity disorder, unspecified type: Secondary | ICD-10-CM | POA: Diagnosis not present

## 2019-12-23 DIAGNOSIS — E039 Hypothyroidism, unspecified: Secondary | ICD-10-CM | POA: Diagnosis present

## 2019-12-23 DIAGNOSIS — Z880 Allergy status to penicillin: Secondary | ICD-10-CM

## 2019-12-23 DIAGNOSIS — Z818 Family history of other mental and behavioral disorders: Secondary | ICD-10-CM

## 2019-12-23 DIAGNOSIS — G479 Sleep disorder, unspecified: Secondary | ICD-10-CM | POA: Diagnosis not present

## 2019-12-23 DIAGNOSIS — Z885 Allergy status to narcotic agent status: Secondary | ICD-10-CM | POA: Diagnosis not present

## 2019-12-23 DIAGNOSIS — Z7984 Long term (current) use of oral hypoglycemic drugs: Secondary | ICD-10-CM

## 2019-12-23 DIAGNOSIS — Z79890 Hormone replacement therapy (postmenopausal): Secondary | ICD-10-CM | POA: Diagnosis not present

## 2019-12-23 DIAGNOSIS — F3110 Bipolar disorder, current episode manic without psychotic features, unspecified: Secondary | ICD-10-CM | POA: Diagnosis present

## 2019-12-23 DIAGNOSIS — Z79899 Other long term (current) drug therapy: Secondary | ICD-10-CM

## 2019-12-23 DIAGNOSIS — E119 Type 2 diabetes mellitus without complications: Secondary | ICD-10-CM | POA: Diagnosis not present

## 2019-12-23 DIAGNOSIS — F419 Anxiety disorder, unspecified: Secondary | ICD-10-CM | POA: Diagnosis present

## 2019-12-23 DIAGNOSIS — I10 Essential (primary) hypertension: Secondary | ICD-10-CM | POA: Diagnosis not present

## 2019-12-23 DIAGNOSIS — Z91030 Bee allergy status: Secondary | ICD-10-CM | POA: Diagnosis not present

## 2019-12-23 DIAGNOSIS — F319 Bipolar disorder, unspecified: Secondary | ICD-10-CM | POA: Diagnosis not present

## 2019-12-23 MED ORDER — ONDANSETRON HCL 4 MG PO TABS: 4.0000 mg | ORAL_TABLET | Freq: Three times a day (TID) | ORAL | Status: DC | PRN

## 2019-12-23 MED ORDER — LOSARTAN POTASSIUM 50 MG PO TABS
100.0000 mg | ORAL_TABLET | Freq: Every day | ORAL | Status: DC
  Administered 2019-12-24: 08:00:00 100 mg via ORAL
  Filled 2019-12-23 (×2): qty 2

## 2019-12-23 MED ORDER — LORAZEPAM 2 MG PO TABS
2.0000 mg | ORAL_TABLET | Freq: Four times a day (QID) | ORAL | Status: DC | PRN
  Administered 2019-12-23 – 2019-12-24 (×2): 2 mg via ORAL
  Filled 2019-12-23 (×2): qty 1

## 2019-12-23 MED ORDER — DIPHENHYDRAMINE HCL 25 MG PO CAPS
50.0000 mg | ORAL_CAPSULE | Freq: Four times a day (QID) | ORAL | Status: DC | PRN
  Administered 2019-12-23: 50 mg via ORAL
  Filled 2019-12-23: qty 2

## 2019-12-23 MED ORDER — MAGNESIUM HYDROXIDE 400 MG/5ML PO SUSP: 30.0000 mL | Freq: Every day | ORAL | Status: DC | PRN

## 2019-12-23 MED ORDER — ZIPRASIDONE MESYLATE 20 MG IM SOLR
40.0000 mg | Freq: Once | INTRAMUSCULAR | Status: AC
  Administered 2019-12-23: 40 mg via INTRAMUSCULAR
  Filled 2019-12-23: qty 40

## 2019-12-23 MED ORDER — METFORMIN HCL ER 750 MG PO TB24
2000.0000 mg | ORAL_TABLET | Freq: Every day | ORAL | Status: DC
  Filled 2019-12-23: qty 1

## 2019-12-23 MED ORDER — QUETIAPINE FUMARATE 200 MG PO TABS
300.0000 mg | ORAL_TABLET | Freq: Every day | ORAL | Status: DC
  Administered 2019-12-23: 300 mg via ORAL
  Filled 2019-12-23: qty 1

## 2019-12-23 MED ORDER — CLONAZEPAM 1 MG PO TABS
1.0000 mg | ORAL_TABLET | Freq: Three times a day (TID) | ORAL | Status: DC | PRN
  Administered 2019-12-23: 1 mg via ORAL
  Filled 2019-12-23: qty 1

## 2019-12-23 MED ORDER — ALUM & MAG HYDROXIDE-SIMETH 200-200-20 MG/5ML PO SUSP: 30.0000 mL | ORAL | Status: DC | PRN

## 2019-12-23 MED ORDER — LEVOTHYROXINE SODIUM 50 MCG PO TABS
50.0000 ug | ORAL_TABLET | Freq: Every day | ORAL | Status: DC
  Administered 2019-12-24: 07:00:00 50 ug via ORAL
  Filled 2019-12-23: qty 1

## 2019-12-23 MED ORDER — ACETAMINOPHEN 325 MG PO TABS
650.0000 mg | ORAL_TABLET | Freq: Four times a day (QID) | ORAL | Status: DC | PRN
  Administered 2019-12-23 – 2019-12-24 (×2): 650 mg via ORAL
  Filled 2019-12-23 (×2): qty 2

## 2019-12-23 MED ORDER — GLIPIZIDE ER 5 MG PO TB24
5.0000 mg | ORAL_TABLET | Freq: Every day | ORAL | Status: DC
  Administered 2019-12-24: 5 mg via ORAL
  Filled 2019-12-23: qty 1

## 2019-12-23 MED ORDER — ROSUVASTATIN CALCIUM 10 MG PO TABS
10.0000 mg | ORAL_TABLET | Freq: Every day | ORAL | Status: DC
  Administered 2019-12-24: 10 mg via ORAL
  Filled 2019-12-23 (×2): qty 1

## 2019-12-23 MED ORDER — LORAZEPAM 2 MG/ML IJ SOLN: 2.0000 mg | Freq: Four times a day (QID) | INTRAMUSCULAR | Status: DC | PRN

## 2019-12-23 MED ORDER — DIPHENHYDRAMINE HCL 50 MG/ML IJ SOLN: 50.0000 mg | Freq: Four times a day (QID) | INTRAMUSCULAR | Status: DC | PRN

## 2019-12-23 NOTE — ED Provider Notes (Signed)
Emergency Medicine Observation Re-evaluation Note  Cheyenne Mccoy is a 54 y.o. female, seen on rounds today.  Pt initially presented to the ED for complaints of IVC Currently, the patient is resting in no acute distress.  Physical Exam  BP (!) 142/98   Pulse 98   Temp 97.8 F (36.6 C)   Resp 18   SpO2 100%  Physical Exam  ED Course / MDM  EKG:  Clinical Course as of Dec 22 640  Sat Dec 20, 2019  1441 Patient becoming increasingly violent towards staff despite receiving Haldol Benadryl and Versed.  Will give additional dose of Versed.  She did pull out her IV.  We will continue to monitor.   [PR]    Clinical Course User Index [PR] Willy Eddy, MD   I have reviewed the labs performed to date as well as medications administered while in observation.  No events overnight.   Plan  Current plan is for psychiatric disposition. Patient is under full IVC at this time.   Irean Hong, MD 12/23/19 [phone]

## 2019-12-23 NOTE — Tx Team (Signed)
Initial Treatment Plan 12/23/2019 6:22 PM [name] AXE:[id number]    PATIENT STRESSORS: Medication change or noncompliance   PATIENT STRENGTHS: Average or above average intelligence Communication skills Supportive family/friends   PATIENT IDENTIFIED PROBLEMS: depression  Anxiety                    DISCHARGE CRITERIA:  Ability to meet basic life and health needs Improved stabilization in mood, thinking, and/or behavior Verbal commitment to aftercare and medication compliance  PRELIMINARY DISCHARGE PLAN: Outpatient therapy Return to previous living arrangement  PATIENT/FAMILY INVOLVEMENT: This treatment plan has been presented to and reviewed with the patient, Cheyenne Mccoy.  The patient and family have been given the opportunity to ask questions and make suggestions.  Chalmers Cater, RN 12/23/2019, 6:22 PM

## 2019-12-23 NOTE — ED Notes (Signed)
Patient refused breakfast 

## 2019-12-23 NOTE — ED Notes (Signed)
Engineering geologist of BMU on unit to speak with patient earlier this afternoon.  Pt was loud, cussing and threw water.   After further discussion patient accepted she will be admitted to [facility] and requested medication to help her stay calm.  40 mg Geodon ordered and administered.

## 2019-12-23 NOTE — ED Notes (Signed)
Pt to nurses station door, agitated. "When can I go home?"  RN reminded patient she is going to be moved downstairs. "You have been saying that and stringing me along all day.  I want to leave. NOW!"
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
## 2019-12-23 NOTE — ED Notes (Signed)
Pt wants to leave AMA.  Psychiatrist made aware of her statement.

## 2019-12-23 NOTE — Tx Team (Signed)
Initial Treatment Plan 12/23/2019 6:57 PM Cheyenne Mccoy [id number]    PATIENT STRESSORS: Marital or family conflict Medication change or noncompliance   PATIENT STRENGTHS: General fund of knowledge Supportive family/friends   PATIENT IDENTIFIED PROBLEMS: Bipolar per patient  Depression  Anxiety  Medication change               DISCHARGE CRITERIA:  Ability to meet basic life and health needs Improved stabilization in mood, thinking, and/or behavior Need for constant or close observation no longer present Reduction of life-threatening or endangering symptoms to within safe limits  PRELIMINARY DISCHARGE PLAN: Outpatient therapy Return to previous living arrangement  PATIENT/FAMILY INVOLVEMENT: This treatment plan has been presented to and reviewed with the patient, Cheyenne Mccoy. The patient has been given the opportunity to ask questions and make suggestions.  Alba Perillo, RN 12/23/2019, 6:57 PM

## 2019-12-23 NOTE — Progress Notes (Signed)
Patient came to station seeking medication for headache at 2011. Patient refused to give pain rating, stating instead that she has had a headache for the past three days and it " hurts like hell" Patient received Tylenol for headache. Patient tolerated without incident.

## 2019-12-23 NOTE — ED Notes (Signed)
Asked patient to take shower patient state's no bitch and walks away.

## 2019-12-23 NOTE — Plan of Care (Signed)
New admission.  Problem: Education: Goal: Utilization of techniques to improve thought processes will improve Outcome: Not Progressing Goal: Knowledge of the prescribed therapeutic regimen will improve Outcome: Not Progressing   Problem: Activity: Goal: Interest or engagement in leisure activities will improve Outcome: Not Progressing Goal: Imbalance in normal sleep/wake cycle will improve Outcome: Not Progressing   Problem: Coping: Goal: Coping ability will improve Outcome: Not Progressing Goal: Will verbalize feelings Outcome: Not Progressing   Problem: Health Behavior/Discharge Planning: Goal: Ability to make decisions will improve Outcome: Not Progressing Goal: Compliance with therapeutic regimen will improve Outcome: Not Progressing   Problem: Role Relationship: Goal: Will demonstrate positive changes in social behaviors and relationships Outcome: Not Progressing   Problem: Safety: Goal: Ability to disclose and discuss suicidal ideas will improve Outcome: Not Progressing Goal: Ability to identify and utilize support systems that promote safety will improve Outcome: Not Progressing   Problem: Self-Concept: Goal: Will verbalize positive feelings about self Outcome: Not Progressing Goal: Level of anxiety will decrease Outcome: Not Progressing   Problem: Education: Goal: Ability to state activities that reduce stress will improve Outcome: Not Progressing   Problem: Coping: Goal: Ability to identify and develop effective coping behavior will improve Outcome: Not Progressing   Problem: Self-Concept: Goal: Level of anxiety will decrease Outcome: Not Progressing Goal: Ability to modify response to factors that promote anxiety will improve Outcome: Not Progressing   Problem: Education: Goal: Mental status will improve Outcome: Not Progressing   Problem: Activity: Goal: Sleeping patterns will improve Outcome: Not Progressing   Problem: Education: Goal:  Knowledge of [facility] Education information/materials will improve Outcome: Not Progressing Goal: Verbalization of understanding the information provided will improve Outcome: Not Progressing   Problem: Health Behavior/Discharge Planning: Goal: Compliance with treatment plan for underlying cause of condition will improve Outcome: Not Progressing   Problem: Education: Goal: Knowledge of [place] [facility] Education information/materials will improve Outcome: Not Progressing Goal: Emotional status will improve Outcome: Not Progressing Goal: Mental status will improve Outcome: Not Progressing Goal: Verbalization of understanding the information provided will improve Outcome: Not Progressing   Problem: Safety: Goal: Periods of time without injury will increase Outcome: Not Progressing   Problem: Coping: Goal: Coping ability will improve Outcome: Not Progressing   Problem: Self-Concept: Goal: Level of anxiety will decrease Outcome: Not Progressing   Problem: Coping: Goal: Ability to identify and develop effective coping behavior will improve Outcome: Not Progressing

## 2019-12-23 NOTE — ED Notes (Signed)
Pt stated she is ready to be transferred to BMU.  Pt also stated she would like some warning ahead of time to know when she would be moved. "I don't like surprises. Don't just show up to my room with a wheelchair." RN agreed to keep the patient updated and not surprise her. Pt thanked this Clinical research associate.

## 2019-12-23 NOTE — ED Notes (Signed)
Pt discharged to BMU under IVC. Refusing VS.  Pt agitated.  Belongings sent with patient.

## 2019-12-23 NOTE — ED Notes (Signed)
Pt told she will be moved in a few minutes. Pt stated. "I need to be medicated. I already fuckin told you that. Get out."
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
## 2019-12-23 NOTE — Plan of Care (Signed)
  Problem: Education: Goal: Utilization of techniques to improve thought processes will improve Outcome: Not Progressing Goal: Knowledge of the prescribed therapeutic regimen will improve Outcome: Not Progressing   Problem: Activity: Goal: Interest or engagement in leisure activities will improve Outcome: Not Progressing Goal: Imbalance in normal sleep/wake cycle will improve Outcome: Not Progressing   Problem: Coping: Goal: Coping ability will improve Outcome: Not Progressing Goal: Will verbalize feelings Outcome: Not Progressing   Problem: Health Behavior/Discharge Planning: Goal: Ability to make decisions will improve Outcome: Not Progressing Goal: Compliance with therapeutic regimen will improve Outcome: Not Progressing   Problem: Role Relationship: Goal: Will demonstrate positive changes in social behaviors and relationships Outcome: Not Progressing   Problem: Safety: Goal: Ability to disclose and discuss suicidal ideas will improve Outcome: Not Progressing Goal: Ability to identify and utilize support systems that promote safety will improve Outcome: Not Progressing   Problem: Self-Concept: Goal: Will verbalize positive feelings about self Outcome: Not Progressing Goal: Level of anxiety will decrease Outcome: Not Progressing   Problem: Education: Goal: Ability to state activities that reduce stress will improve Outcome: Not Progressing   Problem: Coping: Goal: Ability to identify and develop effective coping behavior will improve Outcome: Not Progressing   Problem: Self-Concept: Goal: Level of anxiety will decrease Outcome: Not Progressing Goal: Ability to modify response to factors that promote anxiety will improve Outcome: Not Progressing   Problem: Education: Goal: Mental status will improve Outcome: Not Progressing   Problem: Activity: Goal: Sleeping patterns will improve Outcome: Not Progressing   Problem: Education: Goal: Knowledge of [facility] Education information/materials will improve Outcome: Not Progressing Goal: Verbalization of understanding the information provided will improve Outcome: Not Progressing

## 2019-12-24 DIAGNOSIS — F3110 Bipolar disorder, current episode manic without psychotic features, unspecified: Principal | ICD-10-CM

## 2019-12-24 DIAGNOSIS — E119 Type 2 diabetes mellitus without complications: Secondary | ICD-10-CM

## 2019-12-24 DIAGNOSIS — E039 Hypothyroidism, unspecified: Secondary | ICD-10-CM

## 2019-12-24 MED ORDER — QUETIAPINE FUMARATE 200 MG PO TABS: 600.0000 mg | ORAL_TABLET | Freq: Every day | ORAL | Status: DC

## 2019-12-24 MED ORDER — QUETIAPINE FUMARATE 300 MG PO TABS: 600.0000 mg | ORAL_TABLET | Freq: Every day | ORAL | 0 refills | Status: DC

## 2019-12-24 NOTE — Progress Notes (Signed)
Patient approached nurses station requesting Benadryl that she had declined earlier. Patient received medication and tolerated without incident.

## 2019-12-24 NOTE — Progress Notes (Signed)
Pt refused breakfast. Pt changed rooms to 316.. It is easier to see her when rounding then having to knock loudly as she prefers. To keep the calm she accepted the offer. I offered to raise the bed up and the head. She couldn't read with out readers so I gave her some. She has calmed down. Torrie Mayers RN

## 2019-12-24 NOTE — Discharge Summary (Signed)
Physician Discharge Summary Note  Patient:  Cheyenne Mccoy is an 54 y.o., female MRN:  [id number] DOB:  08-05-1966 Patient phone:  [phone] (home)  Patient address:   [place] [address],  Total Time spent with patient: 1 hour  Date of Admission:  12/23/2019 Date of Discharge: December 24, 2019  Reason for Admission: Patient was admitted through the emergency room after presenting there under IVC reporting threatening and agitated behavior.  Documentation from the emergency room suggests poor self-care and agitation was still going on  Principal Problem: Bipolar affective disorder, current episode manic without psychotic symptoms [facility]) Discharge Diagnoses: Principal Problem:   Bipolar affective disorder, current episode manic without psychotic symptoms ([place]) Active Problems:   Diabetes ([place])   Hypothyroidism   Past Psychiatric History: Patient has a history of chronic affective disorder with diagnoses of bipolar disorder and possible PTSD.  She has no previous suicide attempts.  No history of violence towards others.  She has appropriate outpatient psychiatric care in place  Past Medical History:  Past Medical History:  Diagnosis Date  . ADD (attention deficit disorder)   . ADHD (attention deficit hyperactivity disorder)   . Anxiety   . Bipolar disorder ([place])   . Depression    bipolar   . Diabetes mellitus without complication ([place])   . GERD (gastroesophageal reflux disease)    no current medications  . Hyperlipidemia   . Hypertension   . Hypothyroidism   . Insomnia   . Laceration of index finger    left, low pulse ox reading, gets cold  . Tuberculosis    history positive ppd's - chest xray neg     Past Surgical History:  Procedure Laterality Date  . BREAST SURGERY     left side - cyst removed benign  . CERVICAL POLYPECTOMY N/A 08/19/2013   Procedure: endometrial POLYPECTOMY;  Surgeon: Annalee Genta, DO;  Location: [facility];  Service:  Gynecology;  Laterality: N/A;  . COLONOSCOPY    . DILATATION & CURETTAGE/HYSTEROSCOPY WITH TRUECLEAR N/A 08/19/2013   Procedure: DILATATION & CURETTAGE/HYSTEROSCOPY WITH TRUCLEAR;  Surgeon: Annalee Genta, DO;  Location: [facility];  Service: Gynecology;  Laterality: N/A;  . DILATION AND CURETTAGE OF UTERUS    . DILITATION & CURRETTAGE/HYSTROSCOPY WITH NOVASURE ABLATION N/A 08/19/2013   Procedure: DILATATION & CURETTAGE/HYSTEROSCOPY WITH NOVASURE ABLATION;  Surgeon: Annalee Genta, DO;  Location: [facility];  Service: Gynecology;  Laterality: N/A;  . NERVE REPAIR Left 07/04/2018   Procedure: REPAIR LEFT INDEX NERVE, AXOGUARD TUBE;  Surgeon: [name], MD;  Location: [facility];  Service: Orthopedics;  Laterality: Left;  . PARATHYROIDECTOMY N/A 05/12/2019   Procedure: NECK EXPLORATION WITH PARATHYROIDECTOMY;  Surgeon: Armandina Gemma, MD;  Location: [facility];  Service: General;  Laterality: N/A;  . POLYPECTOMY    . WISDOM TOOTH EXTRACTION    . WOUND EXPLORATION Left 07/04/2018   Procedure: WOUND EXPLORATION;  Surgeon: [name], MD;  Location: [facility];  Service: Orthopedics;  Laterality: Left;   Family History:  Family History  Problem Relation Age of Onset  . Depression Mother   . Drug abuse Brother   . Depression Maternal Uncle   . Depression Maternal Grandmother   . Breast cancer Neg Hx    Family Psychiatric  History: None reported Social History:  Social History   Substance and Sexual Activity  Alcohol Use Not Currently   Comment: occasional     Social History   Substance  and Sexual Activity  Drug Use No    Social History   Socioeconomic History  . Marital status: Divorced    Spouse name: Not on file  . Number of children: 0  . Years of education: Not on file  . Highest education level: Bachelor's degree (e.g., BA, AB, BS)  Occupational History    Comment: disablitity   Tobacco Use  . Smoking status: Former Smoker    Packs/day: 0.50     Years: 20.00    Pack years: 10.00    Types: Cigarettes    Quit date: 03/11/2013    Years since quitting: 6.7  . Smokeless tobacco: Never Used  Substance and Sexual Activity  . Alcohol use: Not Currently    Comment: occasional  . Drug use: No  . Sexual activity: Not Currently    Birth control/protection: None, Surgical    Comment: ablation 2014  Other Topics Concern  . Not on file  Social History Narrative  . Not on file   Social Determinants of Health   Financial Resource Strain:   . Difficulty of Paying Living Expenses:   Food Insecurity:   . Worried About Programme researcher, broadcasting/film/video in the Last Year:   . Barista in the Last Year:   Transportation Needs:   . Freight forwarder (Medical):   Marland Kitchen Lack of Transportation (Non-Medical):   Physical Activity:   . Days of Exercise per Week:   . Minutes of Exercise per Session:   Stress:   . Feeling of Stress :   Social Connections:   . Frequency of Communication with Friends and Family:   . Frequency of Social Gatherings with Friends and Family:   . Attends Religious Services:   . Active Member of Clubs or Organizations:   . Attends Banker Meetings:   Marland Kitchen Marital Status:     Hospital Course: After admission to psychiatric ward, her behavior has been calm and appropriate.  On interview with me today she presents as calm and lucid in her thinking with no sign of psychosis.  Affect slightly anxious but reactive.  Patient denies any suicidal thoughts at all.  Denies any thought of harming anyone else.  Shows good insight and understanding of her illness.  She is requesting discharge because she has a appointment with an ophthalmologist tomorrow morning which she has been waiting for for months about her cataract.  Patient asked me to contact her boyfriend which I did.  He has good insight also about her condition and is agreeable to having her come back home.  He reports that all guns have been removed from the house and  that he will be taking some time off to stay with her and managing her medication.  I have let her primary psychiatrist know that the patient is being discharged as well.  Patient will be discharged today with no change to her medicine other than my recommendation that she at least for the time being discontinue amphetamines and keep her benzodiazepine use to the minimum needed.  Physical Findings: AIMS:  , ,  ,  ,    CIWA:    COWS:     Musculoskeletal: Strength & Muscle Tone: within normal limits Gait & Station: normal Patient leans: N/A  Psychiatric Specialty Exam: Physical Exam  Nursing note and vitals reviewed. Constitutional: She appears well-developed and well-nourished.  HENT:  Head: Normocephalic and atraumatic.  Eyes: Pupils are equal, round, and reactive to light. Conjunctivae are normal.  Cardiovascular: Regular rhythm and normal heart sounds.  Respiratory: Effort normal. No respiratory distress.  GI: Soft.  Musculoskeletal:        General: Normal range of motion.     Cervical back: Normal range of motion.  Neurological: She is alert.  Skin: Skin is warm and dry.  Psychiatric: She has a normal mood and affect. Her behavior is normal. Judgment and thought content normal.    Review of Systems  Constitutional: Negative.   HENT: Negative.   Eyes: Negative.   Respiratory: Negative.   Cardiovascular: Negative.   Gastrointestinal: Negative.   Musculoskeletal: Negative.   Skin: Negative.   Neurological: Negative.   Psychiatric/Behavioral: Negative.     Blood pressure (!) 149/85, pulse 90, temperature 97.8 F (36.6 C), temperature source Oral, resp. rate 16, height 5\' 7"  (1.702 m), weight 106.6 kg, SpO2 100 %.Body mass index is 36.81 kg/m.  General Appearance: Casual  Eye Contact:  Good  Speech:  Clear and Coherent  Volume:  Normal  Mood:  Euthymic  Affect:  Congruent  Thought Process:  Coherent  Orientation:  Full (Time, Place, and Person)  Thought Content:   Logical  Suicidal Thoughts:  No  Homicidal Thoughts:  No  Memory:  Immediate;   Fair Recent;   Fair Remote;   Fair  Judgement:  Fair  Insight:  Fair  Psychomotor Activity:  Normal  Concentration:  Concentration: Fair  Recall:  of Knowledge:  Fair  Language:  Fair  Akathisia:  No  Handed:  Right  AIMS (if indicated):     Assets:  Communication Skills Desire for Improvement Financial Resources/Insurance Housing Resilience Social Support  ADL's:  Intact  Cognition:  WNL  Sleep:  Number of Hours: 7.45        Has this patient used any form of tobacco in the last 30 days? (Cigarettes, Smokeless Tobacco, Cigars, and/or Pipes) Yes, No  Blood Alcohol level:  Lab Results  Component Value Date   ETH <10 12/20/2019    Metabolic Disorder Labs:  Lab Results  Component Value Date   HGBA1C 5.9 (H) 05/08/2019   MPG 122.63 05/08/2019   No results found for: PROLACTIN No results found for: CHOL, TRIG, HDL, CHOLHDL, VLDL, LDLCALC  See Psychiatric Specialty Exam and Suicide Risk Assessment completed by Attending Physician prior to discharge.  Discharge destination:  Home  Is patient on multiple antipsychotic therapies at discharge:  No   Has Patient had three or more failed trials of antipsychotic monotherapy by history:  No  Recommended Plan for Multiple Antipsychotic Therapies: NA  Discharge Instructions    Diet - low sodium heart healthy   Complete by: As directed    Increase activity slowly   Complete by: As directed      Allergies as of 12/24/2019      Reactions   Bee Venom Anaphylaxis   Codeine Anaphylaxis, Hives   Penicillins Anaphylaxis, Hives   Did it involve swelling of the face/tongue/throat, SOB, or low BP? Yes Did it involve sudden or severe rash/hives, skin peeling, or any reaction on the inside of your mouth or nose? No Did you need to seek medical attention at a hospital or doctor's office? Yes When did it last happen?Childhood  allergy If all above answers are "NO", may proceed with cephalosporin use.      Medication List    STOP taking these medications   acetaminophen 325 MG tablet Commonly known as: TYLENOL   amphetamine-dextroamphetamine 30 MG 24 hr  capsule Commonly known as: ADDERALL XR     TAKE these medications     Indication  glipiZIDE 5 MG 24 hr tablet Commonly known as: GLUCOTROL XL Take 5 mg by mouth daily with breakfast.  Indication: Type 2 Diabetes   levothyroxine 50 MCG tablet Commonly known as: SYNTHROID Take 50 mcg by mouth daily before breakfast.  Indication: Underactive Thyroid   losartan 100 MG tablet Commonly known as: COZAAR Take 100 mg by mouth daily.  Indication: High Blood Pressure Disorder   metFORMIN 500 MG 24 hr tablet Commonly known as: GLUCOPHAGE-XR Take 2,000 mg by mouth at bedtime.  Indication: Type 2 Diabetes   QUEtiapine 300 MG tablet Commonly known as: SEROQUEL Take 2 tablets (600 mg total) by mouth at bedtime.  Indication: Depressive Phase of Manic-Depression   rosuvastatin 10 MG tablet Commonly known as: CRESTOR Take 10 mg by mouth daily.  Indication: High Amount of Fats in the Blood      Follow-up Information    Milagros Evener, MD Follow up.   Specialty: Psychiatry Why: Appointment is scheduled 01/16/2020 at 9:15AM.  Appointment is telehealth.  The office will reach out to you a couple of days before the appoitnment to go over instructions and collect payment.  Thanks! Contact information: [address] [place] [address] [phone]           Follow-up recommendations:  Activity:  Activity as tolerated Diet:  Diabetic diet or as recommended by primary doctor Other:  No prescriptions given.  She is to follow-up with her outpatient psychiatrist with her current medication although I have encouraged her to not restart amphetamines and to limit the use of benzodiazepines.  Comments: Patient is aware and agreeable to the  plan  Signed: Mordecai Rasmussen, MD 12/24/2019, 3:04 PM

## 2019-12-24 NOTE — [facility] Group Notes (Signed)
Emotional Regulation 12/24/2019 1PM  Type of Therapy/Topic:  Group Therapy:  Emotion Regulation  Participation Level:  Active   Description of Group:   The purpose of this group is to assist patients in learning to regulate negative emotions and experience positive emotions. Patients will be guided to discuss ways in which they have been vulnerable to their negative emotions. These vulnerabilities will be juxtaposed with experiences of positive emotions or situations, and patients will be challenged to use positive emotions to combat negative ones. Special emphasis will be placed on coping with negative emotions in conflict situations, and patients will process healthy conflict resolution skills.  Therapeutic Goals: 1. Patient will identify two positive emotions or experiences to reflect on in order to balance out negative emotions 2. Patient will label two or more emotions that they find the most difficult to experience 3. Patient will demonstrate positive conflict resolution skills through discussion and/or role plays  Summary of Patient Progress: Pt demonstrated understanding of todays topic of the three mindsets. Pt discussed with group members her experience with each of the mindsets. Pt also provided a personal example of emotional mindset where she admits to behaving negatively while in the hospital. Pt reports she responded in a negative manner because she had safety concerns and when she felt like she was not being heard became upset. Pt was open to feedback from group members and identified healthy coping methods to manage stressful situations.   Therapeutic Modalities:   Cognitive Behavioral Therapy Feelings Identification Dialectical Behavioral Therapy   Suzan Slick, LCSW 12/24/2019 2:11 PM

## 2019-12-24 NOTE — Plan of Care (Signed)
Problem: Education: Goal: Utilization of techniques to improve thought processes will improve 12/24/2019 1603 by Chalmers Cater, RN Outcome: Adequate for Discharge 12/24/2019 1020 by Chalmers Cater, RN Outcome: Progressing Goal: Knowledge of the prescribed therapeutic regimen will improve 12/24/2019 1603 by Chalmers Cater, RN Outcome: Adequate for Discharge 12/24/2019 1020 by Chalmers Cater, RN Outcome: Progressing   Problem: Activity: Goal: Interest or engagement in leisure activities will improve 12/24/2019 1603 by Chalmers Cater, RN Outcome: Adequate for Discharge 12/24/2019 1020 by Chalmers Cater, RN Outcome: Not Progressing Goal: Imbalance in normal sleep/wake cycle will improve 12/24/2019 1603 by Chalmers Cater, RN Outcome: Adequate for Discharge 12/24/2019 1020 by Chalmers Cater, RN Outcome: Progressing   Problem: Coping: Goal: Coping ability will improve 12/24/2019 1603 by Chalmers Cater, RN Outcome: Adequate for Discharge 12/24/2019 1020 by Chalmers Cater, RN Outcome: Progressing Goal: Will verbalize feelings 12/24/2019 1603 by Chalmers Cater, RN Outcome: Adequate for Discharge 12/24/2019 1020 by Chalmers Cater, RN Outcome: Progressing   Problem: Health Behavior/Discharge Planning: Goal: Ability to make decisions will improve 12/24/2019 1603 by Chalmers Cater, RN Outcome: Adequate for Discharge 12/24/2019 1020 by Chalmers Cater, RN Outcome: Progressing Goal: Compliance with therapeutic regimen will improve 12/24/2019 1603 by Chalmers Cater, RN Outcome: Adequate for Discharge 12/24/2019 1020 by Chalmers Cater, RN Outcome: Progressing   Problem: Role Relationship: Goal: Will demonstrate positive changes in social behaviors and relationships 12/24/2019 1603 by Chalmers Cater, RN Outcome: Adequate for Discharge 12/24/2019 1020 by Chalmers Cater, RN Outcome: Progressing   Problem: Safety: Goal: Ability to disclose and discuss suicidal ideas will  improve 12/24/2019 1603 by Chalmers Cater, RN Outcome: Adequate for Discharge 12/24/2019 1020 by Chalmers Cater, RN Outcome: Progressing Goal: Ability to identify and utilize support systems that promote safety will improve 12/24/2019 1603 by Chalmers Cater, RN Outcome: Adequate for Discharge 12/24/2019 1020 by Chalmers Cater, RN Outcome: Progressing   Problem: Self-Concept: Goal: Will verbalize positive feelings about self 12/24/2019 1603 by Chalmers Cater, RN Outcome: Adequate for Discharge 12/24/2019 1020 by Chalmers Cater, RN Outcome: Progressing Goal: Level of anxiety will decrease 12/24/2019 1603 by Chalmers Cater, RN Outcome: Adequate for Discharge 12/24/2019 1020 by Chalmers Cater, RN Outcome: Progressing   Problem: Coping: Goal: Coping ability will improve 12/24/2019 1603 by Chalmers Cater, RN Outcome: Adequate for Discharge 12/24/2019 1020 by Chalmers Cater, RN Outcome: Progressing   Problem: Self-Concept: Goal: Level of anxiety will decrease 12/24/2019 1603 by Chalmers Cater, RN Outcome: Adequate for Discharge 12/24/2019 1020 by Chalmers Cater, RN Outcome: Progressing   Problem: Education: Goal: Ability to state activities that reduce stress will improve 12/24/2019 1603 by Chalmers Cater, RN Outcome: Adequate for Discharge 12/24/2019 1020 by Chalmers Cater, RN Outcome: Progressing   Problem: Coping: Goal: Ability to identify and develop effective coping behavior will improve 12/24/2019 1603 by Chalmers Cater, RN Outcome: Adequate for Discharge 12/24/2019 1020 by Chalmers Cater, RN Outcome: Progressing   Problem: Self-Concept: Goal: Level of anxiety will decrease 12/24/2019 1603 by Chalmers Cater, RN Outcome: Adequate for Discharge 12/24/2019 1020 by Chalmers Cater, RN Outcome: Progressing Goal: Ability to modify response to factors that promote anxiety will improve 12/24/2019 1603 by Chalmers Cater, RN Outcome: Adequate for Discharge 12/24/2019  1020 by Chalmers Cater, RN Outcome: Progressing   Problem: Coping: Goal: Ability to identify and develop effective coping behavior  will improve 12/24/2019 1603 by Kieth Brightly, RN Outcome: Adequate for Discharge 12/24/2019 1020 by Kieth Brightly, RN Outcome: Progressing   Problem: Education: Goal: Mental status will improve 12/24/2019 1603 by Kieth Brightly, RN Outcome: Adequate for Discharge 12/24/2019 1020 by Kieth Brightly, RN Outcome: Not Progressing   Problem: Activity: Goal: Sleeping patterns will improve 12/24/2019 1603 by Kieth Brightly, RN Outcome: Adequate for Discharge 12/24/2019 1020 by Kieth Brightly, RN Outcome: Progressing   Problem: Education: Goal: Knowledge of [place] Education information/materials will improve 12/24/2019 1603 by Kieth Brightly, RN Outcome: Adequate for Discharge 12/24/2019 1020 by Kieth Brightly, RN Outcome: Progressing Goal: Verbalization of understanding the information provided will improve 12/24/2019 1603 by Kieth Brightly, RN Outcome: Adequate for Discharge 12/24/2019 1020 by Kieth Brightly, RN Outcome: Progressing   Problem: Health Behavior/Discharge Planning: Goal: Compliance with treatment plan for underlying cause of condition will improve 12/24/2019 1603 by Kieth Brightly, RN Outcome: Adequate for Discharge 12/24/2019 1020 by Kieth Brightly, RN Outcome: Progressing   Problem: Education: Goal: Knowledge of [place] Education information/materials will improve 12/24/2019 1603 by Kieth Brightly, RN Outcome: Adequate for Discharge 12/24/2019 1020 by Kieth Brightly, RN Outcome: Progressing Goal: Emotional status will improve 12/24/2019 1603 by Kieth Brightly, RN Outcome: Adequate for Discharge 12/24/2019 1020 by Kieth Brightly, RN Outcome: Progressing Goal: Mental status will improve 12/24/2019 1603 by Kieth Brightly, RN Outcome: Adequate for Discharge 12/24/2019 1020 by Kieth Brightly,  RN Outcome: Not Progressing Goal: Verbalization of understanding the information provided will improve 12/24/2019 1603 by Kieth Brightly, RN Outcome: Adequate for Discharge 12/24/2019 1020 by Kieth Brightly, RN Outcome: Progressing   Problem: Safety: Goal: Periods of time without injury will increase 12/24/2019 1603 by Kieth Brightly, RN Outcome: Adequate for Discharge 12/24/2019 1020 by Kieth Brightly, RN Outcome: Progressing

## 2019-12-24 NOTE — Plan of Care (Signed)
Pr sates that she has depression and anxiety but would not rate it... "I will not play that game". Pt denies SI, HI and AVH. Pt was educated on care plan and encouraged to attend groups. Torrie Mayers RN Problem: Education: Goal: Utilization of techniques to improve thought processes will improve Outcome: Progressing Goal: Knowledge of the prescribed therapeutic regimen will improve Outcome: Progressing   Problem: Activity: Goal: Interest or engagement in leisure activities will improve Outcome: Not Progressing Goal: Imbalance in normal sleep/wake cycle will improve Outcome: Progressing   Problem: Coping: Goal: Coping ability will improve Outcome: Progressing Goal: Will verbalize feelings Outcome: Progressing   Problem: Health Behavior/Discharge Planning: Goal: Ability to make decisions will improve Outcome: Progressing Goal: Compliance with therapeutic regimen will improve Outcome: Progressing   Problem: Role Relationship: Goal: Will demonstrate positive changes in social behaviors and relationships Outcome: Progressing   Problem: Safety: Goal: Ability to disclose and discuss suicidal ideas will improve Outcome: Progressing Goal: Ability to identify and utilize support systems that promote safety will improve Outcome: Progressing   Problem: Self-Concept: Goal: Will verbalize positive feelings about self Outcome: Progressing Goal: Level of anxiety will decrease Outcome: Progressing   Problem: Coping: Goal: Coping ability will improve Outcome: Progressing   Problem: Self-Concept: Goal: Level of anxiety will decrease Outcome: Progressing   Problem: Education: Goal: Ability to state activities that reduce stress will improve Outcome: Progressing   Problem: Coping: Goal: Ability to identify and develop effective coping behavior will improve Outcome: Progressing   Problem: Self-Concept: Goal: Level of anxiety will decrease Outcome: Progressing Goal: Ability to  modify response to factors that promote anxiety will improve Outcome: Progressing   Problem: Coping: Goal: Ability to identify and develop effective coping behavior will improve Outcome: Progressing   Problem: Education: Goal: Mental status will improve Outcome: Not Progressing   Problem: Activity: Goal: Sleeping patterns will improve Outcome: Progressing   Problem: Education: Goal: Knowledge of [place] [facility] Education information/materials will improve Outcome: Progressing Goal: Verbalization of understanding the information provided will improve Outcome: Progressing   Problem: Health Behavior/Discharge Planning: Goal: Compliance with treatment plan for underlying cause of condition will improve Outcome: Progressing   Problem: Education: Goal: Knowledge of [place] [facility] Education information/materials will improve Outcome: Progressing Goal: Emotional status will improve Outcome: Progressing Goal: Mental status will improve Outcome: Not Progressing Goal: Verbalization of understanding the information provided will improve Outcome: Progressing
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
## 2019-12-24 NOTE — Progress Notes (Signed)
Pt denies SI, HI and AVH. Pt was educated on care plan and verbalizes understanding. Pt received a new set of clothes and flip flops to go home in. Pt received dc packet and belongings. Pt was educated on dc plan and verbalizes understanding. Torrie Mayers RN
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
## 2019-12-24 NOTE — Progress Notes (Signed)
Recreation Therapy Notes  Date: 12/24/2019  Time: 9:30 am   Location: Craft room   Behavioral response: N/A   Intervention Topic: Values   Discussion/Intervention: Patient did not attend group.   Clinical Observations/Feedback:  Patient did not attend group.   Jazlynn Nemetz LRT/CTRS         Kaloni Bisaillon 12/24/2019 12:13 PM

## 2019-12-24 NOTE — Progress Notes (Signed)
Pt attended Pych Ed group and participated. It was held outdoors. Jozsef Wescoat RN 

## 2019-12-24 NOTE — Progress Notes (Signed)
  [facility] Adult Case Management Discharge Plan :  Will you be returning to the same living situation after discharge:  Yes,  pt reports that she is returning home.  At discharge, do you have transportation home?: Yes,  pt reports that her boyfriend will pick her up. Do you have the ability to pay for your medications: Yes,  [employer] Medicare  Release of information consent forms completed and in the chart;  Patient's signature needed at discharge.  Patient to Follow up at: Follow-up Information    Milagros Evener, MD Follow up.   Specialty: Psychiatry Why: Appointment is scheduled 01/16/2020 at 9:15AM.  Appointment is telehealth.  The office will reach out to you a couple of days before the appoitnment to go over instructions and collect payment.  Thanks! Contact information: [address] [place] [address] [phone]           Next level of care provider has access to [facility] Link:no  Safety Planning and Suicide Prevention discussed: Yes,  SPE completed with pt.  Pt declined collateral contact.     Has patient been referred to the Quitline?: Patient refused referral  Patient has been referred for addiction treatment: Pt. refused referral  Harden Mo, LCSW 12/24/2019, 3:13 PM

## 2019-12-24 NOTE — [facility] Counselor (Signed)
CSW discussed with the patient the recommendation for a therapist in addition to her current medication management.  Patient stated "you don't need to worry about any of that, I'll take care of it".  Pt declined referral for therapy at this time, however consented to continue with medication management and allow the CSW to schedule an aftercare appointment.   Penni Homans, MSW, LCSW 12/24/2019 2:00 PM

## 2019-12-24 NOTE — [facility] Suicide Risk Assessment (Signed)
[facility] INPATIENT:  Family/Significant Other Suicide Prevention Education  Suicide Prevention Education:  Patient Refusal for Family/Significant Other Suicide Prevention Education: The patient Cheyenne Mccoy has refused to provide written consent for family/significant other to be provided Family/Significant Other Suicide Prevention Education during admission and/or prior to discharge.  Physician notified.  SPE completed with pt, as pt refused to consent to family contact. SPI pamphlet provided to pt and pt was encouraged to share information with support network, ask questions, and talk about any concerns relating to SPE. Pt denies access to guns/firearms and verbalized understanding of information provided. Mobile Crisis information also provided to pt.   Harden Mo 12/24/2019, 12:03 PM

## 2019-12-24 NOTE — [facility] Suicide Risk Assessment (Signed)
[facility] Admission Suicide Risk Assessment   Nursing information obtained from:    Demographic factors:  Caucasian Current Mental Status:  Self-harm behaviors, Plan to harm others Loss Factors:  Financial problems / change in socioeconomic status Historical Factors:  Family history of mental illness or substance abuse Risk Reduction Factors:  Positive social support  Total Time spent with patient: 1 hour Principal Problem: Bipolar affective disorder, current episode manic without psychotic symptoms (HCC) Diagnosis:  Principal Problem:   Bipolar affective disorder, current episode manic without psychotic symptoms (HCC) Active Problems:   Diabetes (HCC)   Hypothyroidism  Subjective Data: Patient seen chart reviewed.  54 year old woman with a history of bipolar disorder who has been in the emergency room for about 3 days awaiting admission.  She was brought in under involuntary commitment papers which alleged that the patient had made threatening statements or threatening gestures towards her sister and her boyfriend.  On interview today the patient is lucid clear and emotionally in control.  She completely denies suicidal or homicidal ideation and shows good insight and agrees to appropriate outpatient treatment  Continued Clinical Symptoms:  Alcohol Use Disorder Identification Test Final Score (AUDIT): 0 The "Alcohol Use Disorders Identification Test", Guidelines for Use in Primary Care, Second Edition.  World Science writer [facility]). Score between 0-7:  no or low risk or alcohol related problems. Score between 8-15:  moderate risk of alcohol related problems. Score between 16-19:  high risk of alcohol related problems. Score 20 or above:  warrants further diagnostic evaluation for alcohol dependence and treatment.   CLINICAL FACTORS:   Bipolar Disorder:   Mixed State   Musculoskeletal: Strength & Muscle Tone: within normal limits Gait & Station: normal Patient leans:  N/A  Psychiatric Specialty Exam: Physical Exam  Nursing note and vitals reviewed. Constitutional: She appears well-developed and well-nourished.  HENT:  Head: Normocephalic and atraumatic.  Eyes: Pupils are equal, round, and reactive to light. Conjunctivae are normal.  Cardiovascular: Regular rhythm and normal heart sounds.  Respiratory: Effort normal.  GI: Soft.  Musculoskeletal:        General: Normal range of motion.     Cervical back: Normal range of motion.  Neurological: She is alert.  Skin: Skin is warm and dry.  Psychiatric: Her speech is normal and behavior is normal. Judgment and thought content normal. Her mood appears anxious. Cognition and memory are normal.    Review of Systems  Constitutional: Negative.   HENT: Negative.   Eyes: Negative.   Respiratory: Negative.   Cardiovascular: Negative.   Gastrointestinal: Negative.   Musculoskeletal: Negative.   Skin: Negative.   Neurological: Negative.   Psychiatric/Behavioral: Positive for behavioral problems, dysphoric mood and sleep disturbance. The patient is nervous/anxious.     Blood pressure (!) 149/85, pulse 90, temperature 97.8 F (36.6 C), temperature source Oral, resp. rate 16, height 5\' 7"  (1.702 m), weight 106.6 kg, SpO2 100 %.Body mass index is 36.81 kg/m.  General Appearance: Casual  Eye Contact:  Fair  Speech:  Clear and Coherent  Volume:  Normal  Mood:  Anxious and Euthymic  Affect:  Congruent  Thought Process:  Goal Directed  Orientation:  Full (Time, Place, and Person)  Thought Content:  Logical  Suicidal Thoughts:  No  Homicidal Thoughts:  No  Memory:  Immediate;   Fair Recent;   Poor Remote;   Fair  Judgement:  Fair  Insight:  Fair  Psychomotor Activity:  Normal  Concentration:  Concentration: Fair  Recall:  Fair  Fund of Knowledge:  Fair  Language:  Fair  Akathisia:  No  Handed:  Right  AIMS (if indicated):     Assets:  Communication Skills Desire for Improvement Financial  Resources/Insurance Housing Resilience Social Support  ADL's:  Intact  Cognition:  WNL  Sleep:  Number of Hours: 7.45      COGNITIVE FEATURES THAT CONTRIBUTE TO RISK:  Thought constriction (tunnel vision)    SUICIDE RISK:   Minimal: No identifiable suicidal ideation.  Patients presenting with no risk factors but with morbid ruminations; may be classified as minimal risk based on the severity of the depressive symptoms  PLAN OF CARE: Patient currently is denying any suicidal thoughts whatsoever.  She articulates positive plans and hopefulness for the future.  She is also not showing any inclination or making any statements suggesting of violence.  At this point no longer meets commitment criteria.  She has outpatient treatment in place and I have contacted her provider as well as her boyfriend who is agreeable to coming home.  Patient has been assessed by the full treatment team but is likely to be discharged today without any medication change  I certify that inpatient services furnished can reasonably be expected to improve the patient's condition.   Alethia Berthold, MD 12/24/2019, 2:45 PM

## 2019-12-24 NOTE — [facility] Suicide Risk Assessment (Signed)
[facility] Discharge Suicide Risk Assessment   Principal Problem: Bipolar affective disorder, current episode manic without psychotic symptoms (HCC) Discharge Diagnoses: Principal Problem:   Bipolar affective disorder, current episode manic without psychotic symptoms (HCC) Active Problems:   Diabetes (HCC)   Hypothyroidism   Total Time spent with patient: 1 hour  Musculoskeletal: Strength & Muscle Tone: within normal limits Gait & Station: normal Patient leans: N/A  Psychiatric Specialty Exam: Review of Systems  Constitutional: Negative.   HENT: Negative.   Eyes: Negative.   Respiratory: Negative.   Cardiovascular: Negative.   Gastrointestinal: Negative.   Musculoskeletal: Negative.   Skin: Negative.   Neurological: Negative.   Psychiatric/Behavioral: The patient is nervous/anxious.     Blood pressure (!) 149/85, pulse 90, temperature 97.8 F (36.6 C), temperature source Oral, resp. rate 16, height 5\' 7"  (1.702 m), weight 106.6 kg, SpO2 100 %.Body mass index is 36.81 kg/m.  General Appearance: Casual  Eye Contact::  Good  Speech:  Clear and Coherent409  Volume:  Normal  Mood:  Euthymic  Affect:  Congruent  Thought Process:  Goal Directed  Orientation:  Full (Time, Place, and Person)  Thought Content:  Logical  Suicidal Thoughts:  No  Homicidal Thoughts:  No  Memory:  Immediate;   Fair Recent;   Fair Remote;   Fair  Judgement:  Fair  Insight:  Fair  Psychomotor Activity:  Normal  Concentration:  Fair  Recall:  [phone] of Knowledge:Fair  Language: Fair  Akathisia:  No  Handed:  Right  AIMS (if indicated):     Assets:  Communication Skills Desire for Improvement Financial Resources/Insurance Housing Resilience Social Support  Sleep:  Number of Hours: 7.45  Cognition: WNL  ADL's:  Intact   Mental Status Per Nursing Assessment::   On Admission:  Self-harm behaviors, Plan to harm others  Demographic Factors:  Caucasian and Unemployed  Loss  Factors: Loss of significant relationship and It appears a significant stressor prior to admission was the recent sudden death of one of her pets.  Historical Factors: Impulsivity  Risk Reduction Factors:   Living with another person, especially a relative, Positive social support, Positive therapeutic relationship and Positive coping skills or problem solving skills  Continued Clinical Symptoms:  Bipolar Disorder:   Mixed State  Cognitive Features That Contribute To Risk:  None    Suicide Risk:  Minimal: No identifiable suicidal ideation.  Patients presenting with no risk factors but with morbid ruminations; may be classified as minimal risk based on the severity of the depressive symptoms  Follow-up Information    [phone], MD Follow up.   Specialty: Psychiatry Why: Appointment is scheduled 01/16/2020 at 9:15AM.  Appointment is telehealth.  The office will reach out to you a couple of days before the appoitnment to go over instructions and collect payment.  Thanks! Contact information: [address] [place] [phone]           Plan Of Care/Follow-up recommendations:  Activity:  Activity as tolerated Diet:  Diabetic diet or as recommended by primary care physician Other:  I have recommended that she, at least for the time being, stop taking her Adderall and limit her Xanax and stick with the Seroquel since that seems to be effective and well-tolerated.  Recommend that she follow-up with her primary psychiatrist as soon as possible  [phone], MD 12/24/2019, 2:49 PM

## 2019-12-25 DIAGNOSIS — Z961 Presence of intraocular lens: Secondary | ICD-10-CM | POA: Diagnosis not present

## 2019-12-25 DIAGNOSIS — H2512 Age-related nuclear cataract, left eye: Secondary | ICD-10-CM | POA: Diagnosis not present

## 2019-12-25 DIAGNOSIS — H18413 Arcus senilis, bilateral: Secondary | ICD-10-CM | POA: Diagnosis not present

## 2019-12-25 DIAGNOSIS — H25042 Posterior subcapsular polar age-related cataract, left eye: Secondary | ICD-10-CM | POA: Diagnosis not present

## 2020-01-21 DIAGNOSIS — H25012 Cortical age-related cataract, left eye: Secondary | ICD-10-CM | POA: Diagnosis not present

## 2020-01-21 DIAGNOSIS — H2512 Age-related nuclear cataract, left eye: Secondary | ICD-10-CM | POA: Diagnosis not present

## 2020-01-26 ENCOUNTER — Telehealth (INDEPENDENT_AMBULATORY_CARE_PROVIDER_SITE_OTHER): Payer: Medicare Other | Admitting: Psychiatry

## 2020-01-26 ENCOUNTER — Other Ambulatory Visit: Payer: Self-pay

## 2020-01-26 ENCOUNTER — Telehealth: Payer: Self-pay

## 2020-01-26 ENCOUNTER — Encounter: Payer: Self-pay | Admitting: Psychiatry

## 2020-01-26 DIAGNOSIS — F39 Unspecified mood [affective] disorder: Secondary | ICD-10-CM | POA: Diagnosis not present

## 2020-01-26 DIAGNOSIS — F3181 Bipolar II disorder: Secondary | ICD-10-CM

## 2020-01-26 NOTE — Progress Notes (Signed)
This is an ECT consult for this 54 year old woman with a history of mood disorder.  Patient was reached by telephone.  Total time on the telephone was 48 minutes.  I identified myself and she identified herself satisfactorily.  We confirmed that this was an appointment for possible ECT treatment.  Patient reports current symptoms of constantly sad angry and irritable mood.  Mood feels out of control and bad essentially all of the time.  This is been going on for months on and off and has gotten worse in the last month and a half.  Her sleep is adequate with her current medicine.  Appetite is okay.  She feels hopeless and overwhelmed.  She has passive suicidal thoughts without specific plan.  She denies hallucinations or psychotic symptoms.  Denies any current alcohol or drug use.  She is currently seeing a psychiatrist, Dr.Kaur, who is prescribing Seroquel 600 mg at night as well as alprazolam 2 mg at night and Ambien as needed.  Patient denies homicidal ideation.  She states she is compliant with medicine.  Patient has suffered with mood symptoms going back many years.  Has had at least 1 prior hospitalization.  Her symptoms have been variously diagnosed as major depression or bipolar disorder.  Most of the time she seems to have a consistently bad mood but with occasional spells of hyperactivity for a day or so at a time.  She has been on multiple antidepressants and mood stabilizers without significant improvement.  Patient has high blood pressure hypothyroidism dyslipidemia and some elevated blood sugar.  She has no history of myocardial infarction stroke or skull injury.  Patient was cooperative with the interview and was for the most part a good historian.  Her affect is remarkable for how irritable she sounds.  She frequently sounds exasperated and upset even with basic questioning although she seems to have insight into this.  Describes her mood as being constantly terrible.  Thoughts appear to be  lucid however with no delusions or hallucinations.  No sign of psychotic thinking.  No acute suicidal ideation.  Patient is able to understand the description of ECT treatment and make reasonable decisions.  54 year old woman with longstanding mood disorder unresponsive to multiple medication trials.  Severely impaired and disabled.  Patient is a reasonable candidate for ECT.  I explained to her that with her condition and nonresponse I cannot guarantee outcome.  Chances are perhaps 50% of significant improvement with ECT.  We discussed possible side effects including short-term memory impairment, confusion, headache body ache tachycardia arrhythmia.  Patient was able to ask questions freely.  Patient is tentatively agreeable to a plan for ECT.  She recently had cataract surgery and cannot have treatment for at least 2 weeks after that so we are looking at sometime in June.  I have gone ahead and put in the order form for labs to preadmission testing and given the patient the phone number for the ECT department to call for scheduling.

## 2020-01-28 ENCOUNTER — Telehealth: Payer: Self-pay

## 2020-02-02 ENCOUNTER — Other Ambulatory Visit: Payer: Medicare Other

## 2020-02-03 ENCOUNTER — Other Ambulatory Visit: Payer: Medicare Other

## 2020-02-10 ENCOUNTER — Other Ambulatory Visit: Payer: Medicare Other

## 2020-02-20 DIAGNOSIS — E1165 Type 2 diabetes mellitus with hyperglycemia: Secondary | ICD-10-CM | POA: Diagnosis not present

## 2020-02-20 DIAGNOSIS — I10 Essential (primary) hypertension: Secondary | ICD-10-CM | POA: Diagnosis not present

## 2020-02-20 DIAGNOSIS — E039 Hypothyroidism, unspecified: Secondary | ICD-10-CM | POA: Diagnosis not present

## 2020-03-24 DIAGNOSIS — E7800 Pure hypercholesterolemia, unspecified: Secondary | ICD-10-CM | POA: Diagnosis not present

## 2020-03-24 DIAGNOSIS — I10 Essential (primary) hypertension: Secondary | ICD-10-CM | POA: Diagnosis not present

## 2020-03-24 DIAGNOSIS — E871 Hypo-osmolality and hyponatremia: Secondary | ICD-10-CM | POA: Diagnosis not present

## 2020-05-19 DIAGNOSIS — F319 Bipolar disorder, unspecified: Secondary | ICD-10-CM | POA: Diagnosis not present

## 2020-05-31 DIAGNOSIS — F319 Bipolar disorder, unspecified: Secondary | ICD-10-CM | POA: Diagnosis not present

## 2020-06-07 DIAGNOSIS — F319 Bipolar disorder, unspecified: Secondary | ICD-10-CM | POA: Diagnosis not present

## 2020-06-14 DIAGNOSIS — F319 Bipolar disorder, unspecified: Secondary | ICD-10-CM | POA: Diagnosis not present

## 2020-06-15 DIAGNOSIS — Z23 Encounter for immunization: Secondary | ICD-10-CM | POA: Diagnosis not present

## 2020-06-15 DIAGNOSIS — M25562 Pain in left knee: Secondary | ICD-10-CM | POA: Diagnosis not present

## 2020-06-15 DIAGNOSIS — E1169 Type 2 diabetes mellitus with other specified complication: Secondary | ICD-10-CM | POA: Diagnosis not present

## 2020-06-15 DIAGNOSIS — I10 Essential (primary) hypertension: Secondary | ICD-10-CM | POA: Diagnosis not present

## 2020-06-21 DIAGNOSIS — M25562 Pain in left knee: Secondary | ICD-10-CM | POA: Diagnosis not present

## 2020-06-23 DIAGNOSIS — F319 Bipolar disorder, unspecified: Secondary | ICD-10-CM | POA: Diagnosis not present

## 2020-06-25 DIAGNOSIS — M1712 Unilateral primary osteoarthritis, left knee: Secondary | ICD-10-CM | POA: Diagnosis not present

## 2020-07-02 DIAGNOSIS — F319 Bipolar disorder, unspecified: Secondary | ICD-10-CM | POA: Diagnosis not present

## 2020-07-14 ENCOUNTER — Ambulatory Visit: Payer: Medicare Other | Attending: Sports Medicine

## 2020-07-14 ENCOUNTER — Other Ambulatory Visit: Payer: Self-pay

## 2020-07-14 DIAGNOSIS — M25562 Pain in left knee: Secondary | ICD-10-CM

## 2020-07-14 DIAGNOSIS — F319 Bipolar disorder, unspecified: Secondary | ICD-10-CM | POA: Diagnosis not present

## 2020-07-15 NOTE — Therapy (Signed)
[place] [facility] PHYSICAL AND SPORTS MEDICINE [address], [place], [address] Phone: [phone]   Fax:  [phone]  Patient Details  Name: Cheyenne Mccoy MRN: [id number] Date of Birth: [birth year] Referring Provider:  Christena Deem, MD  Encounter Date: 07/14/2020 PT/OT/SLP Screening Form   Time: in18:15__     Time out 18:34   Complaint L knee pain Past Medical Hx:  Knee pain, Bipolar Injury Date: April 2021 Pain Scale: 0/10 Patient's phone number:   Hx (this occurrence):   Patient is a 54 year old female presenting with left knee pain.  Patient had increased pain from insidious onset starting in April 2021.  Patient had difficulty walking for months afterwards however received cortisone injection 1 month prior to today's date.  Patient states she would like to go over exercises to address this issue.  Patient says she has had no pain for the past 2 weeks and has been performing exercises such as walking and different stretches and strengthening exercises for her leg.   Assessment: All active range of motion measurements were within normal limits for both the hip and the knee during today's session.  Patient with 3+ out of 5 to 4 out of 5 for manual muscle testing assessments.  The most affected areas in terms of weakness were not the lateral hip muscles during hip abduction, hip extension, and hip flexion.  Patient demonstrates some weakness along the affected quadriceps.  Patient reports she would like to perform exercises to address this issue.    Recommendations:   Recommend performing exercises independently.  Educated patient to report back to clinic if pain worsens. Comments:    []  Patient would benefit from an MD referral []  Patient would benefit from a full PT/OT/ SLP evaluation and treatment. [x]  No intervention recommended at this time.   , PT DPT 07/15/2020, 1:32 PM  [place] [facility] PHYSICAL AND SPORTS MEDICINE [address] [facility], [address] Phone: [phone]   Fax:  [phone]

## 2020-07-29 DIAGNOSIS — F319 Bipolar disorder, unspecified: Secondary | ICD-10-CM | POA: Diagnosis not present

## 2020-08-03 DIAGNOSIS — F319 Bipolar disorder, unspecified: Secondary | ICD-10-CM | POA: Diagnosis not present

## 2020-08-11 DIAGNOSIS — F319 Bipolar disorder, unspecified: Secondary | ICD-10-CM | POA: Diagnosis not present

## 2020-08-17 DIAGNOSIS — F319 Bipolar disorder, unspecified: Secondary | ICD-10-CM | POA: Diagnosis not present

## 2020-08-30 DIAGNOSIS — Z0001 Encounter for general adult medical examination with abnormal findings: Secondary | ICD-10-CM | POA: Diagnosis not present

## 2020-08-30 DIAGNOSIS — E559 Vitamin D deficiency, unspecified: Secondary | ICD-10-CM | POA: Diagnosis not present

## 2020-08-30 DIAGNOSIS — E1169 Type 2 diabetes mellitus with other specified complication: Secondary | ICD-10-CM | POA: Diagnosis not present

## 2020-08-30 DIAGNOSIS — E7800 Pure hypercholesterolemia, unspecified: Secondary | ICD-10-CM | POA: Diagnosis not present

## 2020-09-14 ENCOUNTER — Other Ambulatory Visit: Payer: Self-pay | Admitting: Family Medicine

## 2020-09-14 DIAGNOSIS — Z1231 Encounter for screening mammogram for malignant neoplasm of breast: Secondary | ICD-10-CM

## 2020-09-14 DIAGNOSIS — F319 Bipolar disorder, unspecified: Secondary | ICD-10-CM | POA: Diagnosis not present

## 2020-09-15 DIAGNOSIS — L650 Telogen effluvium: Secondary | ICD-10-CM | POA: Diagnosis not present

## 2020-09-21 DIAGNOSIS — F319 Bipolar disorder, unspecified: Secondary | ICD-10-CM | POA: Diagnosis not present

## 2020-09-22 DIAGNOSIS — Z79899 Other long term (current) drug therapy: Secondary | ICD-10-CM | POA: Diagnosis not present

## 2020-10-07 DIAGNOSIS — F319 Bipolar disorder, unspecified: Secondary | ICD-10-CM | POA: Diagnosis not present

## 2020-10-12 DIAGNOSIS — F319 Bipolar disorder, unspecified: Secondary | ICD-10-CM | POA: Diagnosis not present

## 2020-10-19 DIAGNOSIS — F319 Bipolar disorder, unspecified: Secondary | ICD-10-CM | POA: Diagnosis not present

## 2020-10-25 ENCOUNTER — Ambulatory Visit
Admission: RE | Admit: 2020-10-25 | Discharge: 2020-10-25 | Disposition: A | Discharge status: Home / Self Care | Payer: Medicare Other | Source: Ambulatory Visit | Attending: Family Medicine | Admitting: Family Medicine

## 2020-10-25 ENCOUNTER — Other Ambulatory Visit: Payer: Self-pay

## 2020-10-25 DIAGNOSIS — Z1231 Encounter for screening mammogram for malignant neoplasm of breast: Secondary | ICD-10-CM

## 2020-10-26 DIAGNOSIS — F319 Bipolar disorder, unspecified: Secondary | ICD-10-CM | POA: Diagnosis not present

## 2020-11-04 DIAGNOSIS — F319 Bipolar disorder, unspecified: Secondary | ICD-10-CM | POA: Diagnosis not present

## 2020-11-09 DIAGNOSIS — F319 Bipolar disorder, unspecified: Secondary | ICD-10-CM | POA: Diagnosis not present

## 2020-11-23 DIAGNOSIS — F319 Bipolar disorder, unspecified: Secondary | ICD-10-CM | POA: Diagnosis not present

## 2020-11-30 DIAGNOSIS — F319 Bipolar disorder, unspecified: Secondary | ICD-10-CM | POA: Diagnosis not present

## 2020-12-08 DIAGNOSIS — F319 Bipolar disorder, unspecified: Secondary | ICD-10-CM | POA: Diagnosis not present

## 2020-12-16 DIAGNOSIS — F319 Bipolar disorder, unspecified: Secondary | ICD-10-CM | POA: Diagnosis not present

## 2020-12-21 DIAGNOSIS — F319 Bipolar disorder, unspecified: Secondary | ICD-10-CM | POA: Diagnosis not present

## 2021-01-04 DIAGNOSIS — F319 Bipolar disorder, unspecified: Secondary | ICD-10-CM | POA: Diagnosis not present

## 2021-01-12 DIAGNOSIS — F319 Bipolar disorder, unspecified: Secondary | ICD-10-CM | POA: Diagnosis not present

## 2021-01-19 DIAGNOSIS — F319 Bipolar disorder, unspecified: Secondary | ICD-10-CM | POA: Diagnosis not present

## 2021-01-27 DIAGNOSIS — F319 Bipolar disorder, unspecified: Secondary | ICD-10-CM | POA: Diagnosis not present

## 2021-02-16 DIAGNOSIS — F319 Bipolar disorder, unspecified: Secondary | ICD-10-CM | POA: Diagnosis not present

## 2021-02-25 IMAGING — MG MM DIGITAL SCREENING BILAT W/ TOMO AND CAD
6 of 10 series · 6 of 30 positions shown · non-contrast
Comparison: Previous exam(s).

CLINICAL DATA: Screening.

EXAM:
DIGITAL SCREENING BILATERAL MAMMOGRAM WITH TOMOSYNTHESIS AND CAD
TECHNIQUE: Bilateral screening digital craniocaudal and mediolateral oblique
mammograms were obtained. Bilateral screening digital breast
tomosynthesis was performed. The images were evaluated with
computer-aided detection.

[L MLO synth-2D (1 of 2)]
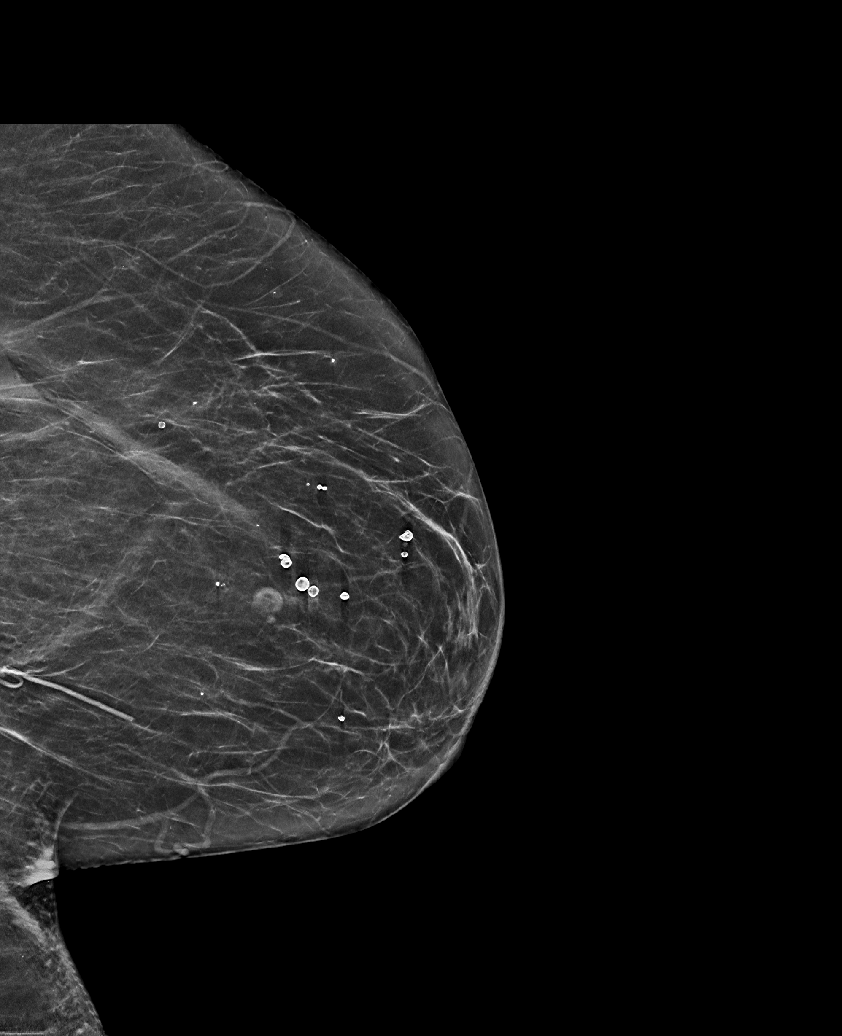

[R CC synth-2D]
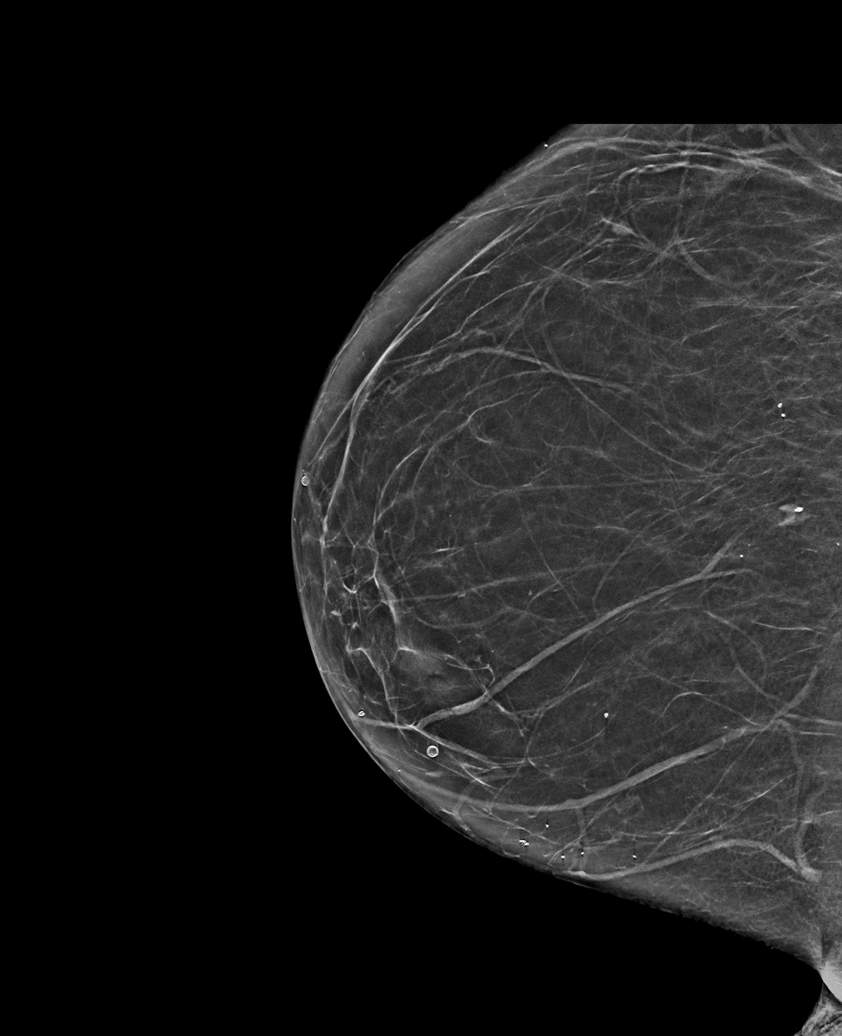

[L MLO synth-2D (2 of 2)]
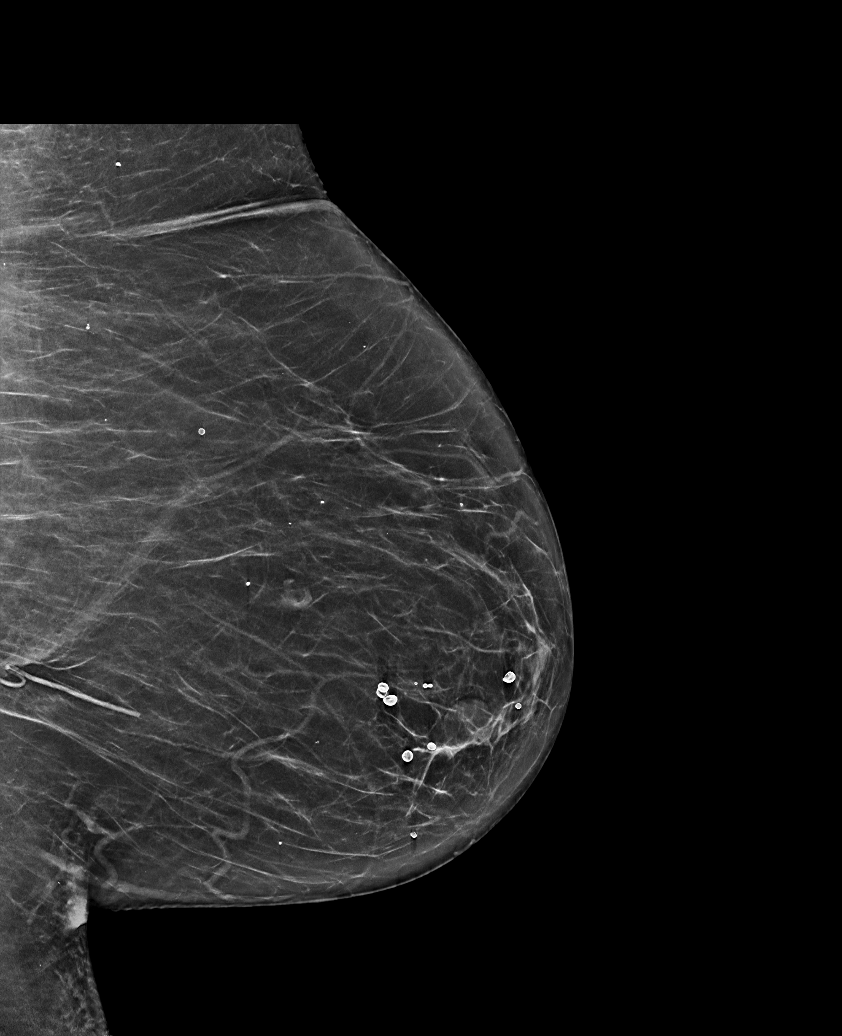

[L CC synth-2D]
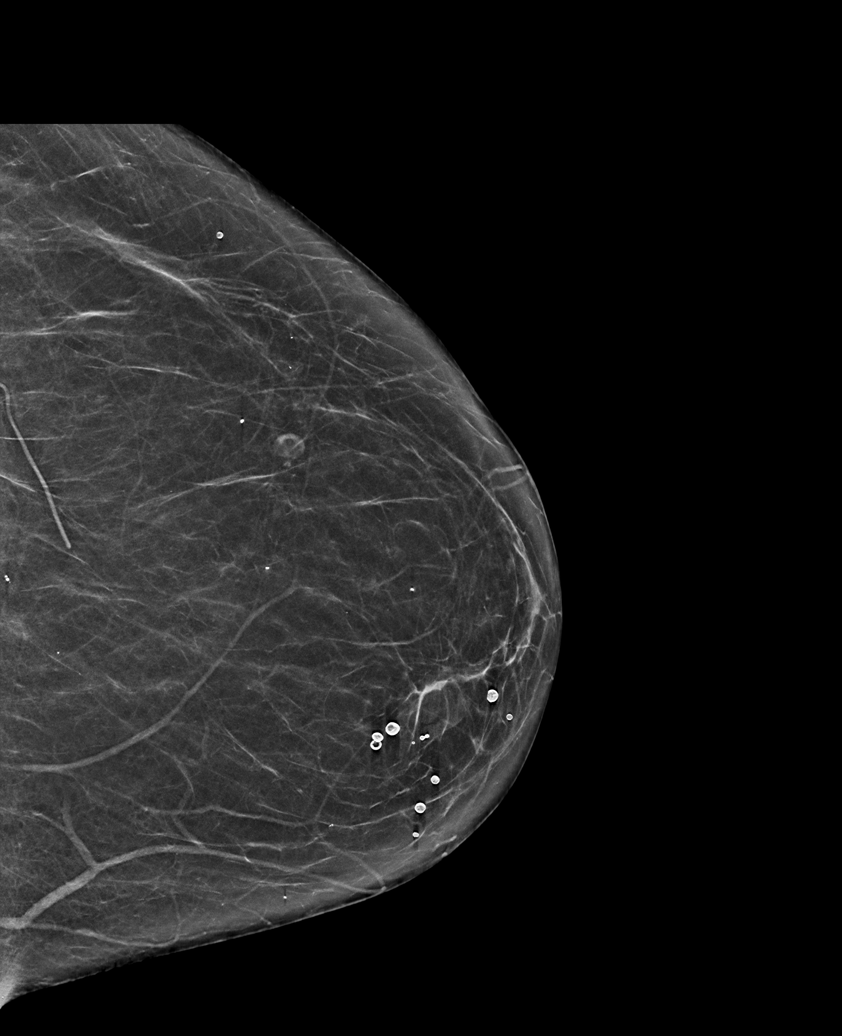

[R MLO synth-2D]
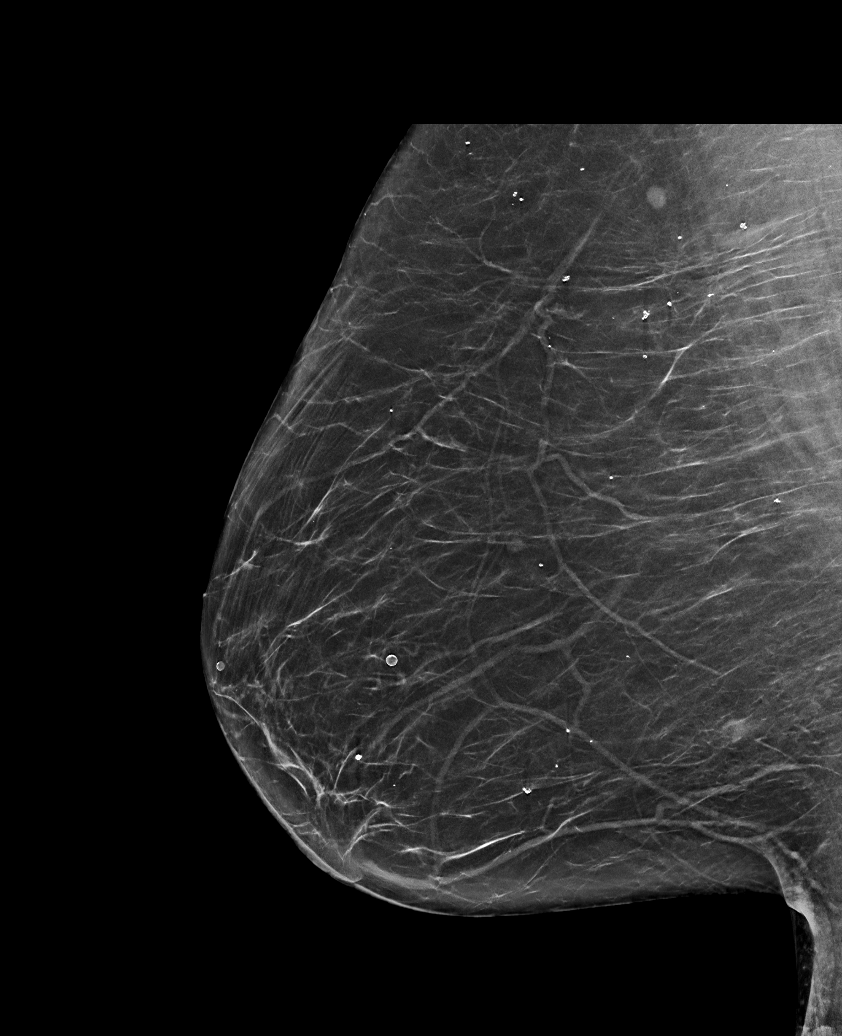

[R MLO tomo · tomo slice 34/67.0]
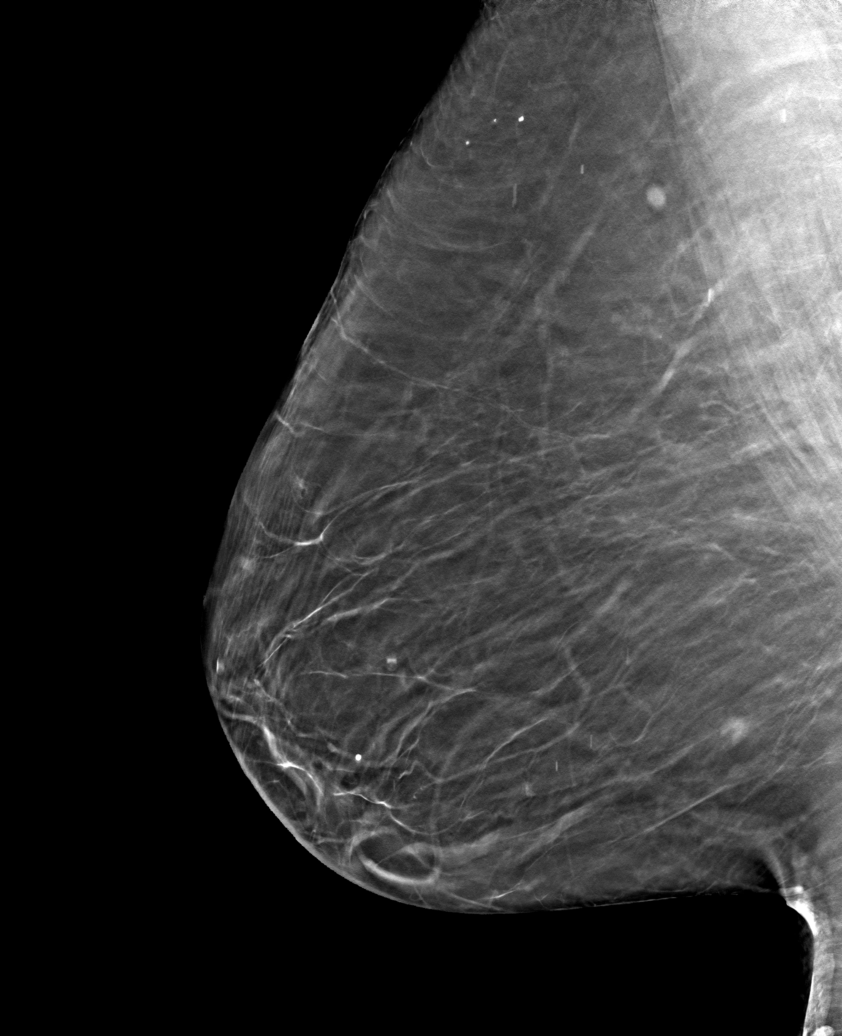

[6 of 30 positions shown; findings below may reference images not displayed]

ACR Breast Density Category b: There are scattered areas of
fibroglandular density.
FINDINGS: There are no findings suspicious for malignancy.
IMPRESSION: No mammographic evidence of malignancy. A result letter of this
screening mammogram will be mailed directly to the patient.

RECOMMENDATION:
Screening mammogram in one year. (Code:51-O-LD2)

BI-RADS CATEGORY  1: Negative.

## 2021-02-28 DIAGNOSIS — E538 Deficiency of other specified B group vitamins: Secondary | ICD-10-CM | POA: Diagnosis not present

## 2021-02-28 DIAGNOSIS — E7800 Pure hypercholesterolemia, unspecified: Secondary | ICD-10-CM | POA: Diagnosis not present

## 2021-02-28 DIAGNOSIS — Z79899 Other long term (current) drug therapy: Secondary | ICD-10-CM | POA: Diagnosis not present

## 2021-02-28 DIAGNOSIS — E039 Hypothyroidism, unspecified: Secondary | ICD-10-CM | POA: Diagnosis not present

## 2021-02-28 DIAGNOSIS — E1169 Type 2 diabetes mellitus with other specified complication: Secondary | ICD-10-CM | POA: Diagnosis not present

## 2021-02-28 DIAGNOSIS — E559 Vitamin D deficiency, unspecified: Secondary | ICD-10-CM | POA: Diagnosis not present

## 2021-03-02 DIAGNOSIS — F319 Bipolar disorder, unspecified: Secondary | ICD-10-CM | POA: Diagnosis not present

## 2021-03-09 DIAGNOSIS — F319 Bipolar disorder, unspecified: Secondary | ICD-10-CM | POA: Diagnosis not present

## 2021-03-30 DIAGNOSIS — R002 Palpitations: Secondary | ICD-10-CM | POA: Diagnosis not present

## 2021-04-01 DIAGNOSIS — R000 Tachycardia, unspecified: Secondary | ICD-10-CM | POA: Diagnosis not present

## 2021-04-01 DIAGNOSIS — E871 Hypo-osmolality and hyponatremia: Secondary | ICD-10-CM | POA: Diagnosis not present

## 2021-04-01 DIAGNOSIS — R5383 Other fatigue: Secondary | ICD-10-CM | POA: Diagnosis not present

## 2021-04-04 ENCOUNTER — Telehealth: Payer: Self-pay

## 2021-04-04 NOTE — Telephone Encounter (Signed)
Referral notes sent from [facility], Phone #: [phone], Fax #: [phone]   Notes sent to scheduling 

## 2021-04-05 ENCOUNTER — Other Ambulatory Visit (HOSPITAL_COMMUNITY): Payer: Self-pay | Admitting: Family Medicine

## 2021-04-05 DIAGNOSIS — F319 Bipolar disorder, unspecified: Secondary | ICD-10-CM | POA: Diagnosis not present

## 2021-04-05 DIAGNOSIS — R000 Tachycardia, unspecified: Secondary | ICD-10-CM

## 2021-04-06 DIAGNOSIS — G4700 Insomnia, unspecified: Secondary | ICD-10-CM | POA: Insufficient documentation

## 2021-04-06 DIAGNOSIS — K219 Gastro-esophageal reflux disease without esophagitis: Secondary | ICD-10-CM | POA: Insufficient documentation

## 2021-04-06 DIAGNOSIS — F32A Depression, unspecified: Secondary | ICD-10-CM | POA: Insufficient documentation

## 2021-04-06 DIAGNOSIS — A159 Respiratory tuberculosis unspecified: Secondary | ICD-10-CM | POA: Insufficient documentation

## 2021-04-06 DIAGNOSIS — F419 Anxiety disorder, unspecified: Secondary | ICD-10-CM | POA: Insufficient documentation

## 2021-04-06 DIAGNOSIS — F988 Other specified behavioral and emotional disorders with onset usually occurring in childhood and adolescence: Secondary | ICD-10-CM | POA: Insufficient documentation

## 2021-04-06 DIAGNOSIS — E785 Hyperlipidemia, unspecified: Secondary | ICD-10-CM | POA: Insufficient documentation

## 2021-04-06 DIAGNOSIS — S61218A Laceration without foreign body of other finger without damage to nail, initial encounter: Secondary | ICD-10-CM | POA: Insufficient documentation

## 2021-04-06 DIAGNOSIS — I10 Essential (primary) hypertension: Secondary | ICD-10-CM | POA: Insufficient documentation

## 2021-04-07 ENCOUNTER — Ambulatory Visit: Payer: Medicare Other | Admitting: Cardiology

## 2021-04-07 ENCOUNTER — Other Ambulatory Visit: Payer: Self-pay | Admitting: *Deleted

## 2021-04-07 ENCOUNTER — Encounter: Payer: Self-pay | Admitting: Cardiology

## 2021-04-07 ENCOUNTER — Other Ambulatory Visit: Payer: Self-pay

## 2021-04-07 VITALS — BP 142/74 | HR 120 | Ht 67.0 in | Wt 218.0 lb

## 2021-04-07 DIAGNOSIS — R002 Palpitations: Secondary | ICD-10-CM

## 2021-04-07 DIAGNOSIS — R5383 Other fatigue: Secondary | ICD-10-CM

## 2021-04-07 DIAGNOSIS — I10 Essential (primary) hypertension: Secondary | ICD-10-CM | POA: Diagnosis not present

## 2021-04-07 DIAGNOSIS — E782 Mixed hyperlipidemia: Secondary | ICD-10-CM | POA: Diagnosis not present

## 2021-04-07 DIAGNOSIS — R011 Cardiac murmur, unspecified: Secondary | ICD-10-CM

## 2021-04-07 DIAGNOSIS — E088 Diabetes mellitus due to underlying condition with unspecified complications: Secondary | ICD-10-CM | POA: Diagnosis not present

## 2021-04-07 DIAGNOSIS — R000 Tachycardia, unspecified: Secondary | ICD-10-CM

## 2021-04-07 HISTORY — DX: Cardiac murmur, unspecified: R01.1

## 2021-04-07 HISTORY — DX: Tachycardia, unspecified: R00.0

## 2021-04-07 HISTORY — DX: Palpitations: R00.2

## 2021-04-07 MED ORDER — DILTIAZEM HCL ER COATED BEADS 120 MG PO CP24: 120.0000 mg | ORAL_CAPSULE | Freq: Every day | ORAL | 3 refills | Status: DC

## 2021-04-07 MED ORDER — LOSARTAN POTASSIUM 50 MG PO TABS: 50.0000 mg | ORAL_TABLET | Freq: Every day | ORAL | 3 refills | Status: DC

## 2021-04-07 NOTE — Patient Instructions (Signed)
Medication Instructions:  Your physician has recommended you make the following change in your medication:   Stop Losartan Potassium/HCTZ Start Losartan 50 mg daily. Start Cardizem CD 120 mg daily.  Keep a BP log for 2 weeks and send it in by mail or MyChart.   *If you need a refill on your cardiac medications before your next appointment, please call your pharmacy*   Lab Work: None ordered If you have labs (blood work) drawn today and your tests are completely normal, you will receive your results only by: MyChart Message (if you have MyChart) OR A paper copy in the mail If you have any lab test that is abnormal or we need to change your treatment, we will call you to review the results.   Testing/Procedures: Your physician has requested that you have an echocardiogram. Echocardiography is a painless test that uses sound waves to create images of your heart. It provides your doctor with information about the size and shape of your heart and how well your heart's chambers and valves are working. This procedure takes approximately one hour. There are no restrictions for this procedure.    Follow-Up: At [facility], you and your health needs are our priority.  As part of our continuing mission to provide you with exceptional heart care, we have created designated Provider Care Teams.  These Care Teams include your primary Cardiologist (physician) and Advanced Practice Providers (APPs -  Physician Assistants and Nurse Practitioners) who all work together to provide you with the care you need, when you need it.  We recommend signing up for the patient portal called "MyChart".  Sign up information is provided on this After Visit Summary.  MyChart is used to connect with patients for Virtual Visits (Telemedicine).  Patients are able to view lab/test results, encounter notes, upcoming appointments, etc.  Non-urgent messages can be sent to your provider as well.   To learn more about what you  can do with MyChart, go to [email or web address].    Your next appointment:   3 month(s)  The format for your next appointment:   In Person  Provider:   Belva Crome, MD   Other Instructions Echocardiogram An echocardiogram is a test that uses sound waves (ultrasound) to produce images of the heart. Images from an echocardiogram can provide important information about: Heart size and shape. The size and thickness and movement of your heart's walls. Heart muscle function and strength. Heart valve function or if you have stenosis. Stenosis is when the heart valves are too narrow. If blood is flowing backward through the heart valves (regurgitation). A tumor or infectious growth around the heart valves. Areas of heart muscle that are not working well because of poor blood flow or injury from a heart attack. Aneurysm detection. An aneurysm is a weak or damaged part of an artery wall. The wall bulges out from the normal force of blood pumping through the body. Tell a health care provider about: Any allergies you have. All medicines you are taking, including vitamins, herbs, eye drops, creams, and over-the-counter medicines. Any blood disorders you have. Any surgeries you have had. Any medical conditions you have. Whether you are pregnant or may be pregnant. What are the risks? Generally, this is a safe test. However, problems may occur, including an allergic reaction to dye (contrast) that may be used during the test. What happens before the test? No specific preparation is needed. You may eat and drink normally. What happens during the test? You will  take off your clothes from the waist up and put on a hospital gown. Electrodes or electrocardiogram (ECG)patches may be placed on your chest. The electrodes or patches are then connected to a device that monitors your heart rate and rhythm. You will lie down on a table for an ultrasound exam. A gel will be applied to your chest to  help sound waves pass through your skin. A handheld device, called a transducer, will be pressed against your chest and moved over your heart. The transducer produces sound waves that travel to your heart and bounce back (or "echo" back) to the transducer. These sound waves will be captured in real-time and changed into images of your heart that can be viewed on a video monitor. The images will be recorded on a computer and reviewed by your health care provider. You may be asked to change positions or hold your breath for a short time. This makes it easier to get different views or better views of your heart. In some cases, you may receive contrast through an IV in one of your veins. This can improve the quality of the pictures from your heart. The procedure may vary among health care providers and hospitals.   What can I expect after the test? You may return to your normal, everyday life, including diet, activities, and medicines, unless your health care provider tells you not to do that. Follow these instructions at home: It is up to you to get the results of your test. Ask your health care provider, or the department that is doing the test, when your results will be ready. Keep all follow-up visits. This is important. Summary An echocardiogram is a test that uses sound waves (ultrasound) to produce images of the heart. Images from an echocardiogram can provide important information about the size and shape of your heart, heart muscle function, heart valve function, and other possible heart problems. You do not need to do anything to prepare before this test. You may eat and drink normally. After the echocardiogram is completed, you may return to your normal, everyday life, unless your health care provider tells you not to do that. This information is not intended to replace advice given to you by your health care provider. Make sure you discuss any questions you have with your health care  provider. Document Revised: 04/20/2020 Document Reviewed: 04/20/2020 Elsevier Patient Education  2021 [employer].

## 2021-04-07 NOTE — Progress Notes (Signed)
Cardiology Office Note:    Date:  04/07/2021   ID:  Cheyenne Mccoy, DOB 1966-02-20, MRN [id number]  PCP:  Darrow Bussing, MD  Cardiologist:  Garwin Brothers, MD   Referring MD: Darrow Bussing, MD    ASSESSMENT:    1. Primary hypertension   2. Mixed hyperlipidemia   3. Diabetes mellitus due to underlying condition with unspecified complications (HCC)   4. Sinus tachycardia   5. Palpitations   6. Cardiac murmur    PLAN:    In order of problems listed above:  Primary prevention stressed with the patient.  Importance of compliance with diet medication stressed and she vocalized understanding.  She has good exercise tolerance and her effort is good and she exercises regularly.  I congratulated her about this. Essential hypertension: Blood pressure stable and diet was emphasized. Mixed dyslipidemia and diabetes mellitus: On statin therapy by primary care.  She is on guideline directed medical therapy. Obesity: Weight reduction was stressed and diet was emphasized. Sinus tachycardia: I will cut down her losartan hydrochlorothiazide to only losartan 50 mg daily.  I will switch her medications and add Cardizem CD 120 mg daily.  She will keep a track of her pulse and blood pressure.  She is a Designer, jewellery.  She will send [place] readings of her vital signs in 2 weeks and we will review them. Patient will be seen in follow-up appointment in 2 months or earlier if the patient has any concerns.  Patient had multiple questions which were answered to her satisfaction.   Medication Adjustments/Labs and Tests Ordered: Current medicines are reviewed at length with the patient today.  Concerns regarding medicines are outlined above.  No orders of the defined types were placed in this encounter.  No orders of the defined types were placed in this encounter.    History of Present Illness:    Cheyenne Mccoy is a 55 y.o. female who is being seen today for the evaluation of palpitations  and elevated heart rate at the request of Koirala, Dibas, MD. patient is a pleasant 55 year old female.  She has past medical history of bipolar disorder, essential hypertension, dyslipidemia and diabetes mellitus.  She mentions to me that she exercises and goes to the gym on a regular basis.  With this she has no symptoms.  No chest pain orthopnea or PND.  She experiences palpitations and tells me that she has baseline heart rate that is elevated.  At the time of my evaluation, the patient is alert awake oriented and in no distress.  Past Medical History:  Diagnosis Date   ADD (attention deficit disorder)    ADHD (attention deficit hyperactivity disorder)    Anxiety    Bipolar disorder (HCC)    Depression    bipolar    Diabetes mellitus without complication (HCC)    GERD (gastroesophageal reflux disease)    no current medications   Hyperlipidemia    Hypertension    Hypothyroidism    Insomnia    Laceration of index finger    left, low pulse ox reading, gets cold   Tuberculosis    history positive ppd's - chest xray neg     Past Surgical History:  Procedure Laterality Date   BREAST SURGERY     left side - cyst removed benign   CERVICAL POLYPECTOMY N/A 08/19/2013   Procedure: endometrial POLYPECTOMY;  Surgeon: [facility] Seller, DO;  Location: [facility];  Service: Gynecology;  Laterality: N/A;   COLONOSCOPY  DILATATION & CURETTAGE/HYSTEROSCOPY WITH TRUECLEAR N/A 08/19/2013   Procedure: DILATATION & CURETTAGE/HYSTEROSCOPY WITH TRUCLEAR;  Surgeon: [facility] Seller, DO;  Location: [facility];  Service: Gynecology;  Laterality: N/A;   DILATION AND CURETTAGE OF UTERUS     DILITATION & CURRETTAGE/HYSTROSCOPY WITH NOVASURE ABLATION N/A 08/19/2013   Procedure: DILATATION & CURETTAGE/HYSTEROSCOPY WITH NOVASURE ABLATION;  Surgeon: [facility] Seller, DO;  Location: [facility];  Service: Gynecology;  Laterality: N/A;   NERVE REPAIR Left 07/04/2018   Procedure: REPAIR LEFT INDEX NERVE, AXOGUARD TUBE;  Surgeon:  Cindee Salt, MD;  Location: [facility];  Service: Orthopedics;  Laterality: Left;   PARATHYROIDECTOMY N/A 05/12/2019   Procedure: NECK EXPLORATION WITH PARATHYROIDECTOMY;  Surgeon: Darnell Level, MD;  Location: [facility];  Service: General;  Laterality: N/A;   POLYPECTOMY     WISDOM TOOTH EXTRACTION     WOUND EXPLORATION Left 07/04/2018   Procedure: WOUND EXPLORATION;  Surgeon: Cindee Salt, MD;  Location: [facility];  Service: Orthopedics;  Laterality: Left;    Current Medications: Current Meds  Medication Sig   acetaminophen (TYLENOL) 500 MG tablet Take 500 mg by mouth every 6 (six) hours as needed for moderate pain.   ALPRAZolam (XANAX) 1 MG tablet Take 1 mg by mouth 4 (four) times daily as needed for anxiety.   amphetamine-dextroamphetamine (ADDERALL) 20 MG tablet Take 20 mg by mouth daily as needed for other.   Choline Fenofibrate (FENOFIBRIC ACID) 135 MG CPDR Take 1 capsule by mouth daily.   cyanocobalamin (,VITAMIN B-12,) 1000 MCG/ML injection Inject 1,000 mcg into the muscle every 30 (thirty) days.   divalproex (DEPAKOTE ER) 500 MG 24 hr tablet Take 500 mg by mouth at bedtime.   levothyroxine (SYNTHROID, LEVOTHROID) 50 MCG tablet Take 50 mcg by mouth daily before breakfast.    metFORMIN (GLUCOPHAGE-XR) 500 MG 24 hr tablet Take 2,000 mg by mouth at bedtime.    Omega-3 Fatty Acids (FISH OIL) 1200 MG CAPS Take 2 capsules by mouth daily.   ondansetron (ZOFRAN) 8 MG tablet Take by mouth every 8 (eight) hours as needed for nausea or vomiting.   QUEtiapine (SEROQUEL XR) 400 MG 24 hr tablet Take 1 tablet by mouth daily.   rosuvastatin (CRESTOR) 10 MG tablet Take 10 mg by mouth daily.   Vitamin D, Ergocalciferol, 50000 units CAPS Take 1 capsule by mouth once a week.   zolpidem (AMBIEN CR) 12.5 MG CR tablet Take 12.5 mg by mouth at bedtime as needed for sleep.   [DISCONTINUED] losartan-hydrochlorothiazide (HYZAAR) 100-12.5 MG tablet Take 1 tablet by mouth daily.      Allergies:   Bee venom, Codeine, and Penicillins   Social History   Socioeconomic History   Marital status: Divorced    Spouse name: Not on file   Number of children: 0   Years of education: Not on file   Highest education level: Bachelor's degree (e.g., BA, AB, BS)  Occupational History    Comment: disablitity   Tobacco Use   Smoking status: Former    Packs/day: 0.50    Years: 20.00    Pack years: 10.00    Types: Cigarettes    Quit date: 03/11/2013    Years since quitting: 8.0   Smokeless tobacco: Never  Vaping Use   Vaping Use: Never used  Substance and Sexual Activity   Alcohol use: Not Currently    Comment: occasional   Drug use: No   Sexual activity: Not Currently    Birth control/protection: None, Surgical  Comment: ablation 2014  Other Topics Concern   Not on file  Social History Narrative   Not on file   Social Determinants of Health   Financial Resource Strain: Not on file  Food Insecurity: Not on file  Transportation Needs: Not on file  Physical Activity: Not on file  Stress: Not on file  Social Connections: Not on file     Family History: The patient's family history includes Depression in her maternal grandmother, maternal uncle, and mother; Drug abuse in her brother. There is no history of Breast cancer.  ROS:   Please see the history of present illness.    All other systems reviewed and are negative.  EKGs/Labs/Other Studies Reviewed:    The following studies were reviewed today: EKG reveals sinus tachycardia with nonspecific ST-T changes.   Recent Labs: No results found for requested labs within last 8760 hours.  Recent Lipid Panel No results found for: CHOL, TRIG, HDL, CHOLHDL, VLDL, LDLCALC, LDLDIRECT  Physical Exam:    VS:  BP (!) 142/74   Pulse (!) 120   Ht 5\' 7"  (1.702 m)   Wt 218 lb (98.9 kg)   SpO2 98%   BMI 34.14 kg/m     Wt Readings from Last 3 Encounters:  04/07/21 218 lb (98.9 kg)  05/12/19 256 lb (116.1 kg)   05/08/19 256 lb (116.1 kg)     GEN: Patient is in no acute distress HEENT: Normal NECK: No JVD; No carotid bruits LYMPHATICS: No lymphadenopathy CARDIAC: S1 S2 regular, 2/6 systolic murmur at the apex. RESPIRATORY:  Clear to auscultation without rales, wheezing or rhonchi  ABDOMEN: Soft, non-tender, non-distended MUSCULOSKELETAL:  No edema; No deformity  SKIN: Warm and dry NEUROLOGIC:  Alert and oriented x 3 PSYCHIATRIC:  Normal affect    Signed, 05/10/19, MD  04/07/2021 11:33 AM    [facility]

## 2021-04-12 ENCOUNTER — Ambulatory Visit (INDEPENDENT_AMBULATORY_CARE_PROVIDER_SITE_OTHER): Payer: Medicare Other

## 2021-04-12 DIAGNOSIS — R5383 Other fatigue: Secondary | ICD-10-CM

## 2021-04-12 DIAGNOSIS — R000 Tachycardia, unspecified: Secondary | ICD-10-CM

## 2021-04-12 DIAGNOSIS — R002 Palpitations: Secondary | ICD-10-CM

## 2021-04-20 DIAGNOSIS — F319 Bipolar disorder, unspecified: Secondary | ICD-10-CM | POA: Diagnosis not present

## 2021-04-22 ENCOUNTER — Ambulatory Visit (HOSPITAL_COMMUNITY): Payer: Medicare Other | Attending: Cardiovascular Disease

## 2021-04-22 ENCOUNTER — Other Ambulatory Visit: Payer: Self-pay

## 2021-04-22 DIAGNOSIS — R011 Cardiac murmur, unspecified: Secondary | ICD-10-CM

## 2021-04-22 LAB — ECHOCARDIOGRAM COMPLETE
Area-P 1/2: 5.31 cm2
S' Lateral: 3.4 cm

## 2021-04-27 DIAGNOSIS — F319 Bipolar disorder, unspecified: Secondary | ICD-10-CM | POA: Diagnosis not present

## 2021-05-02 MED ORDER — METOPROLOL SUCCINATE ER 25 MG PO TB24: 25.0000 mg | ORAL_TABLET | Freq: Every day | ORAL | 3 refills | Status: DC

## 2021-05-18 DIAGNOSIS — F319 Bipolar disorder, unspecified: Secondary | ICD-10-CM | POA: Diagnosis not present

## 2021-05-24 DIAGNOSIS — F319 Bipolar disorder, unspecified: Secondary | ICD-10-CM | POA: Diagnosis not present

## 2021-06-22 DIAGNOSIS — F319 Bipolar disorder, unspecified: Secondary | ICD-10-CM | POA: Diagnosis not present

## 2021-06-27 DIAGNOSIS — F319 Bipolar disorder, unspecified: Secondary | ICD-10-CM | POA: Diagnosis not present

## 2021-07-13 ENCOUNTER — Other Ambulatory Visit: Payer: Self-pay

## 2021-07-13 ENCOUNTER — Ambulatory Visit: Payer: Medicare Other | Admitting: Cardiology

## 2021-07-13 ENCOUNTER — Encounter: Payer: Self-pay | Admitting: Cardiology

## 2021-07-13 VITALS — BP 126/74 | HR 98 | Ht 67.0 in | Wt 228.0 lb

## 2021-07-13 DIAGNOSIS — I10 Essential (primary) hypertension: Secondary | ICD-10-CM

## 2021-07-13 DIAGNOSIS — F419 Anxiety disorder, unspecified: Secondary | ICD-10-CM

## 2021-07-13 DIAGNOSIS — R002 Palpitations: Secondary | ICD-10-CM

## 2021-07-13 DIAGNOSIS — F3160 Bipolar disorder, current episode mixed, unspecified: Secondary | ICD-10-CM

## 2021-07-13 DIAGNOSIS — R000 Tachycardia, unspecified: Secondary | ICD-10-CM

## 2021-07-13 NOTE — Patient Instructions (Signed)

## 2021-07-13 NOTE — Progress Notes (Signed)
Cardiology Office Note:    Date:  07/13/2021   ID:  Cheyenne Mccoy, DOB 31-Dec-1965, MRN [id number]  PCP:  Darrow Bussing, MD  Cardiologist:  Gypsy Balsam, MD    Referring MD: Darrow Bussing, MD   Chief Complaint  Patient presents with   Improved     History of Present Illness:    Cheyenne Mccoy is a 55 y.o. female who was referred to [place] because of sinus tachycardia.  Quite extensive evaluation has been performed that included echocardiogram showing preserved left ventricle ejection fraction, she did wear a monitor which shows some sinus tachycardia but no worrisome arrhythmia.  She was given calcium channel blocker as well as small dose of beta-blocker.  Since that time she seems to be doing better.  She also has bipolar disorder as well as anxiety.  She is to exercise on the regular basis which keep her stable however lately she have a problem with her father who is 41 years old he did have non-STEMI after that atrial fibrillation required pacemaker that kind of stress her out but now things are stabilizing and she is planning to go back to the gym.  She denies any palpitation overall feels better.  No chest pain tightness squeezing pressure burning chest  Past Medical History:  Diagnosis Date   ADD (attention deficit disorder)    ADHD (attention deficit hyperactivity disorder)    Anxiety    Bipolar disorder (HCC)    Cardiac murmur 04/07/2021   Depression    bipolar    Diabetes mellitus without complication (HCC)    GERD (gastroesophageal reflux disease)    no current medications   Hyperlipidemia    Hypertension    Hypothyroidism    Insomnia    Laceration of index finger    left, low pulse ox reading, gets cold   Left hand pain 07/01/2018   Palpitations 04/07/2021   Sinus tachycardia 04/07/2021   Tuberculosis    history positive ppd's - chest xray neg     Past Surgical History:  Procedure Laterality Date   BREAST SURGERY     left side - cyst removed benign    CERVICAL POLYPECTOMY N/A 08/19/2013   Procedure: endometrial POLYPECTOMY;  Surgeon: [facility] Seller, DO;  Location: [facility];  Service: Gynecology;  Laterality: N/A;   COLONOSCOPY     DILATATION & CURETTAGE/HYSTEROSCOPY WITH TRUECLEAR N/A 08/19/2013   Procedure: DILATATION & CURETTAGE/HYSTEROSCOPY WITH TRUCLEAR;  Surgeon: [facility] Seller, DO;  Location: [facility];  Service: Gynecology;  Laterality: N/A;   DILATION AND CURETTAGE OF UTERUS     DILITATION & CURRETTAGE/HYSTROSCOPY WITH NOVASURE ABLATION N/A 08/19/2013   Procedure: DILATATION & CURETTAGE/HYSTEROSCOPY WITH NOVASURE ABLATION;  Surgeon: [facility] Seller, DO;  Location: [facility];  Service: Gynecology;  Laterality: N/A;   NERVE REPAIR Left 07/04/2018   Procedure: REPAIR LEFT INDEX NERVE, AXOGUARD TUBE;  Surgeon: Cindee Salt, MD;  Location: [facility];  Service: Orthopedics;  Laterality: Left;   PARATHYROIDECTOMY N/A 05/12/2019   Procedure: NECK EXPLORATION WITH PARATHYROIDECTOMY;  Surgeon: Darnell Level, MD;  Location: [facility];  Service: General;  Laterality: N/A;   POLYPECTOMY     WISDOM TOOTH EXTRACTION     WOUND EXPLORATION Left 07/04/2018   Procedure: WOUND EXPLORATION;  Surgeon: Cindee Salt, MD;  Location: [facility];  Service: Orthopedics;  Laterality: Left;    Current Medications: Current Meds  Medication Sig   acetaminophen (TYLENOL) 500 MG tablet Take 500 mg by mouth every  6 (six) hours as needed for moderate pain.   ALPRAZolam (XANAX) 1 MG tablet Take 1 mg by mouth 4 (four) times daily as needed for anxiety.   amphetamine-dextroamphetamine (ADDERALL) 20 MG tablet Take 20 mg by mouth daily as needed for other (ADHD).   Choline Fenofibrate (FENOFIBRIC ACID) 135 MG CPDR Take 1 capsule by mouth daily.   cyanocobalamin (,VITAMIN B-12,) 1000 MCG/ML injection Inject 1,000 mcg into the muscle every 30 (thirty) days.   diltiazem (CARDIZEM CD) 120 MG 24 hr capsule Take 1 capsule (120 mg total) by mouth daily.    divalproex (DEPAKOTE ER) 500 MG 24 hr tablet Take 500 mg by mouth at bedtime.   levothyroxine (SYNTHROID, LEVOTHROID) 50 MCG tablet Take 50 mcg by mouth daily before breakfast.    losartan (COZAAR) 50 MG tablet Take 1 tablet (50 mg total) by mouth daily.   metFORMIN (GLUCOPHAGE-XR) 500 MG 24 hr tablet Take 2,000 mg by mouth at bedtime.    metoprolol succinate (TOPROL XL) 25 MG 24 hr tablet Take 1 tablet (25 mg total) by mouth daily.   Omega-3 Fatty Acids (FISH OIL) 1200 MG CAPS Take 2 capsules by mouth daily.   ondansetron (ZOFRAN) 8 MG tablet Take 8 mg by mouth every 8 (eight) hours as needed for nausea or vomiting.   QUEtiapine (SEROQUEL XR) 400 MG 24 hr tablet Take 1 tablet by mouth daily.   rosuvastatin (CRESTOR) 10 MG tablet Take 10 mg by mouth daily.   Vitamin D, Ergocalciferol, 50000 units CAPS Take 1 capsule by mouth once a week.   zolpidem (AMBIEN CR) 12.5 MG CR tablet Take 12.5 mg by mouth at bedtime as needed for sleep.     Allergies:   Bee venom, Codeine, and Penicillins   Social History   Socioeconomic History   Marital status: Divorced    Spouse name: Not on file   Number of children: 0   Years of education: Not on file   Highest education level: Bachelor's degree (e.g., BA, AB, BS)  Occupational History    Comment: disablitity   Tobacco Use   Smoking status: Former    Packs/day: 0.50    Years: 20.00    Pack years: 10.00    Types: Cigarettes    Quit date: 03/11/2013    Years since quitting: 8.3   Smokeless tobacco: Never  Vaping Use   Vaping Use: Never used  Substance and Sexual Activity   Alcohol use: Not Currently    Comment: occasional   Drug use: No   Sexual activity: Not Currently    Birth control/protection: None, Surgical    Comment: ablation 2014  Other Topics Concern   Not on file  Social History Narrative   Not on file   Social Determinants of Health   Financial Resource Strain: Not on file  Food Insecurity: Not on file  Transportation Needs:  Not on file  Physical Activity: Not on file  Stress: Not on file  Social Connections: Not on file     Family History: The patient's family history includes Depression in her maternal grandmother, maternal uncle, and mother; Drug abuse in her brother. There is no history of Breast cancer. ROS:   Please see the history of present illness.    All 14 point review of systems negative except as described per history of present illness  EKGs/Labs/Other Studies Reviewed:      Recent Labs: No results found for requested labs within last 8760 hours.  Recent Lipid Panel No  results found for: CHOL, TRIG, HDL, CHOLHDL, VLDL, LDLCALC, LDLDIRECT  Physical Exam:    VS:  BP 126/74 (BP Location: Right Arm, Patient Position: Sitting)   Pulse 98   Ht 5\' 7"  (1.702 m)   Wt 228 lb (103.4 kg)   SpO2 98%   BMI 35.71 kg/m     Wt Readings from Last 3 Encounters:  07/13/21 228 lb (103.4 kg)  04/07/21 218 lb (98.9 kg)  05/12/19 256 lb (116.1 kg)     GEN:  Well nourished, well developed in no acute distress HEENT: Normal NECK: No JVD; No carotid bruits LYMPHATICS: No lymphadenopathy CARDIAC: RRR, no murmurs, no rubs, no gallops RESPIRATORY:  Clear to auscultation without rales, wheezing or rhonchi  ABDOMEN: Soft, non-tender, non-distended MUSCULOSKELETAL:  No edema; No deformity  SKIN: Warm and dry LOWER EXTREMITIES: no swelling NEUROLOGIC:  Alert and oriented x 3 PSYCHIATRIC:  Normal affect   ASSESSMENT:    1. Sinus tachycardia   2. Palpitations   3. Anxiety   4. Bipolar affective disorder, current episode mixed, current episode severity unspecified (HCC)   5. Primary hypertension    PLAN:    In order of problems listed above:  Sinus tachycardia.  Successfully controlled with beta-blocker which I will continue. Palpitations overall doing well from that point review continue present management. Anxiety and bipolar disorder that is being managed by primary care physician Essential  hypertension blood pressure well controlled continue present management. Dyslipidemia I did review K PN which show me LDL of 55 HDL 38 good cholesterol profile we will continue present management which include Crestor 10   Medication Adjustments/Labs and Tests Ordered: Current medicines are reviewed at length with the patient today.  Concerns regarding medicines are outlined above.  No orders of the defined types were placed in this encounter.  Medication changes: No orders of the defined types were placed in this encounter.   Signed, 05/14/19, MD, [facility] 07/13/2021 11:45 AM    [facility]

## 2021-07-14 DIAGNOSIS — F319 Bipolar disorder, unspecified: Secondary | ICD-10-CM | POA: Diagnosis not present

## 2021-07-20 DIAGNOSIS — F319 Bipolar disorder, unspecified: Secondary | ICD-10-CM | POA: Diagnosis not present

## 2021-08-24 DIAGNOSIS — F319 Bipolar disorder, unspecified: Secondary | ICD-10-CM | POA: Diagnosis not present

## 2021-10-04 DIAGNOSIS — E039 Hypothyroidism, unspecified: Secondary | ICD-10-CM | POA: Diagnosis not present

## 2021-10-04 DIAGNOSIS — E538 Deficiency of other specified B group vitamins: Secondary | ICD-10-CM | POA: Diagnosis not present

## 2021-10-04 DIAGNOSIS — I10 Essential (primary) hypertension: Secondary | ICD-10-CM | POA: Diagnosis not present

## 2021-10-04 DIAGNOSIS — Z79899 Other long term (current) drug therapy: Secondary | ICD-10-CM | POA: Diagnosis not present

## 2021-10-04 DIAGNOSIS — Z0001 Encounter for general adult medical examination with abnormal findings: Secondary | ICD-10-CM | POA: Diagnosis not present

## 2021-10-04 DIAGNOSIS — E1169 Type 2 diabetes mellitus with other specified complication: Secondary | ICD-10-CM | POA: Diagnosis not present

## 2021-10-04 DIAGNOSIS — E7800 Pure hypercholesterolemia, unspecified: Secondary | ICD-10-CM | POA: Diagnosis not present

## 2021-10-13 DIAGNOSIS — E119 Type 2 diabetes mellitus without complications: Secondary | ICD-10-CM | POA: Diagnosis not present

## 2021-10-13 DIAGNOSIS — Z961 Presence of intraocular lens: Secondary | ICD-10-CM | POA: Diagnosis not present

## 2021-10-14 DIAGNOSIS — F319 Bipolar disorder, unspecified: Secondary | ICD-10-CM | POA: Diagnosis not present

## 2021-11-28 DIAGNOSIS — F319 Bipolar disorder, unspecified: Secondary | ICD-10-CM | POA: Diagnosis not present

## 2021-11-28 DIAGNOSIS — F419 Anxiety disorder, unspecified: Secondary | ICD-10-CM | POA: Diagnosis not present

## 2021-12-02 DIAGNOSIS — E1169 Type 2 diabetes mellitus with other specified complication: Secondary | ICD-10-CM | POA: Diagnosis not present

## 2021-12-02 DIAGNOSIS — Z79899 Other long term (current) drug therapy: Secondary | ICD-10-CM | POA: Diagnosis not present

## 2021-12-02 DIAGNOSIS — I10 Essential (primary) hypertension: Secondary | ICD-10-CM | POA: Diagnosis not present

## 2021-12-20 ENCOUNTER — Other Ambulatory Visit: Payer: Self-pay | Admitting: Cardiology

## 2021-12-20 DIAGNOSIS — R000 Tachycardia, unspecified: Secondary | ICD-10-CM

## 2021-12-20 DIAGNOSIS — F419 Anxiety disorder, unspecified: Secondary | ICD-10-CM | POA: Diagnosis not present

## 2021-12-20 DIAGNOSIS — I10 Essential (primary) hypertension: Secondary | ICD-10-CM

## 2021-12-20 DIAGNOSIS — F319 Bipolar disorder, unspecified: Secondary | ICD-10-CM | POA: Diagnosis not present

## 2022-01-02 DIAGNOSIS — Z79899 Other long term (current) drug therapy: Secondary | ICD-10-CM | POA: Diagnosis not present

## 2022-01-02 DIAGNOSIS — E1169 Type 2 diabetes mellitus with other specified complication: Secondary | ICD-10-CM | POA: Diagnosis not present

## 2022-01-08 ENCOUNTER — Other Ambulatory Visit: Payer: Self-pay | Admitting: Cardiology

## 2022-01-10 DIAGNOSIS — F419 Anxiety disorder, unspecified: Secondary | ICD-10-CM | POA: Diagnosis not present

## 2022-01-10 DIAGNOSIS — F319 Bipolar disorder, unspecified: Secondary | ICD-10-CM | POA: Diagnosis not present

## 2022-01-25 DIAGNOSIS — K760 Fatty (change of) liver, not elsewhere classified: Secondary | ICD-10-CM | POA: Insufficient documentation

## 2022-01-25 DIAGNOSIS — E559 Vitamin D deficiency, unspecified: Secondary | ICD-10-CM | POA: Insufficient documentation

## 2022-01-25 DIAGNOSIS — E7800 Pure hypercholesterolemia, unspecified: Secondary | ICD-10-CM | POA: Insufficient documentation

## 2022-01-25 DIAGNOSIS — E538 Deficiency of other specified B group vitamins: Secondary | ICD-10-CM | POA: Insufficient documentation

## 2022-01-25 DIAGNOSIS — M179 Osteoarthritis of knee, unspecified: Secondary | ICD-10-CM | POA: Insufficient documentation

## 2022-01-25 DIAGNOSIS — Z8639 Personal history of other endocrine, nutritional and metabolic disease: Secondary | ICD-10-CM | POA: Insufficient documentation

## 2022-01-31 ENCOUNTER — Ambulatory Visit: Payer: Medicare Other | Admitting: Cardiology

## 2022-01-31 ENCOUNTER — Encounter: Payer: Self-pay | Admitting: Cardiology

## 2022-01-31 VITALS — BP 126/82 | HR 100 | Ht 67.0 in | Wt 239.0 lb

## 2022-01-31 DIAGNOSIS — R000 Tachycardia, unspecified: Secondary | ICD-10-CM | POA: Diagnosis not present

## 2022-01-31 DIAGNOSIS — F3110 Bipolar disorder, current episode manic without psychotic features, unspecified: Secondary | ICD-10-CM

## 2022-01-31 DIAGNOSIS — R0609 Other forms of dyspnea: Secondary | ICD-10-CM

## 2022-01-31 DIAGNOSIS — I10 Essential (primary) hypertension: Secondary | ICD-10-CM | POA: Diagnosis not present

## 2022-01-31 DIAGNOSIS — E782 Mixed hyperlipidemia: Secondary | ICD-10-CM | POA: Diagnosis not present

## 2022-01-31 NOTE — Patient Instructions (Signed)
Medication Instructions:  Your physician recommends that you continue on your current medications as directed. Please refer to the Current Medication list given to you today.  *If you need a refill on your cardiac medications before your next appointment, please call your pharmacy*   Lab Work: None Ordered If you have labs (blood work) drawn today and your tests are completely normal, you will receive your results only by: MyChart Message (if you have MyChart) OR A paper copy in the mail If you have any lab test that is abnormal or we need to change your treatment, we will call you to review the results.   Testing/Procedures: Your physician has requested that you have an echocardiogram. Echocardiography is a painless test that uses sound waves to create images of your heart. It provides your doctor with information about the size and shape of your heart and how well your heart's chambers and valves are working. This procedure takes approximately one hour. There are no restrictions for this procedure.    Follow-Up: At [facility], you and your health needs are our priority.  As part of our continuing mission to provide you with exceptional heart care, we have created designated Provider Care Teams.  These Care Teams include your primary Cardiologist (physician) and Advanced Practice Providers (APPs -  Physician Assistants and Nurse Practitioners) who all work together to provide you with the care you need, when you need it.  We recommend signing up for the patient portal called "MyChart".  Sign up information is provided on this After Visit Summary.  MyChart is used to connect with patients for Virtual Visits (Telemedicine).  Patients are able to view lab/test results, encounter notes, upcoming appointments, etc.  Non-urgent messages can be sent to your provider as well.   To learn more about what you can do with MyChart, go to [email or web address].    Your next appointment:   12  month(s)  The format for your next appointment:   In Person  Provider:   Robert Krasowski, MD    Other Instructions NA  

## 2022-01-31 NOTE — Progress Notes (Signed)
Cardiology Office Note:    Date:  01/31/2022   ID:  Cheyenne DunnRachael R Mccoy, DOB 12/16/1965, MRN [id number]  PCP:  Cheyenne Mccoy, Dibas, MD  Cardiologist:  Cheyenne Balsamobert Anderson [place], MD    Referring MD: Cheyenne Mccoy, Dibas, MD   Chief Complaint  Patient presents with   Follow-up  Doing fine  History of Present Illness:    Cheyenne Mccoy is a 56 y.o. female he was referred initially because of sinus tachycardia quite extensive ablation has been performed which included echocardiogram showing preserved left ventricle ejection fraction.  She did wear a monitor which shows some sinus tachycardia but no otherwise worrisome arrhythmia she was given small dose of beta-blocker small dose of calcium channel blocker with quite a good response.  She does take Adderall.  She is coming today to my office for follow-up overall she is doing well denies of any chest pain tightness squeezing pressure burning chest.  She does have seasonal affective disorder and she is kind of have difficulty getting better after last winter.  Denies have any chest pain tightness squeezing pressure burning chest no dizziness no passing out  Past Medical History:  Diagnosis Date   ADD (attention deficit disorder)    ADHD (attention deficit hyperactivity disorder)    Anxiety    Bipolar disorder (HCC)    Cardiac murmur 04/07/2021   Depression    bipolar    Diabetes mellitus without complication (HCC)    GERD (gastroesophageal reflux disease)    no current medications   Hyperlipidemia    Hypertension    Hypothyroidism    Insomnia    Laceration of index finger    left, low pulse ox reading, gets cold   Left hand pain 07/01/2018   Palpitations 04/07/2021   Sinus tachycardia 04/07/2021   Tuberculosis    history positive ppd's - chest xray neg     Past Surgical History:  Procedure Laterality Date   BREAST SURGERY     left side - cyst removed benign   CERVICAL POLYPECTOMY N/A 08/19/2013   Procedure: endometrial POLYPECTOMY;   Surgeon: Cheyenne SellerJennifer M Ozan, DO;  Location: [facility];  Service: Gynecology;  Laterality: N/A;   COLONOSCOPY     DILATATION & CURETTAGE/HYSTEROSCOPY WITH TRUECLEAR N/A 08/19/2013   Procedure: DILATATION & CURETTAGE/HYSTEROSCOPY WITH TRUCLEAR;  Surgeon: Cheyenne SellerJennifer M Ozan, DO;  Location: [facility];  Service: Gynecology;  Laterality: N/A;   DILATION AND CURETTAGE OF UTERUS     DILITATION & CURRETTAGE/HYSTROSCOPY WITH NOVASURE ABLATION N/A 08/19/2013   Procedure: DILATATION & CURETTAGE/HYSTEROSCOPY WITH NOVASURE ABLATION;  Surgeon: Cheyenne SellerJennifer M Ozan, DO;  Location: [facility];  Service: Gynecology;  Laterality: N/A;   NERVE REPAIR Left 07/04/2018   Procedure: REPAIR LEFT INDEX NERVE, AXOGUARD TUBE;  Surgeon: Cheyenne Mccoy, Gary, MD;  Location: [facility];  Service: Orthopedics;  Laterality: Left;   PARATHYROIDECTOMY N/A 05/12/2019   Procedure: NECK EXPLORATION WITH PARATHYROIDECTOMY;  Surgeon: Cheyenne Mccoy, Todd, MD;  Location: [facility];  Service: General;  Laterality: N/A;   POLYPECTOMY     WISDOM TOOTH EXTRACTION     WOUND EXPLORATION Left 07/04/2018   Procedure: WOUND EXPLORATION;  Surgeon: Cheyenne Mccoy, Gary, MD;  Location: [facility];  Service: Orthopedics;  Laterality: Left;    Current Medications: Current Meds  Medication Sig   acetaminophen (TYLENOL) 500 MG tablet Take 500 mg by mouth every 6 (six) hours as needed for moderate pain.   ALPRAZolam (XANAX) 1 MG tablet Take 1 mg by mouth 4 (four) times daily as needed  for anxiety.   amphetamine-dextroamphetamine (ADDERALL) 20 MG tablet Take 20 mg by mouth daily as needed for other (ADHD).   Choline Fenofibrate (FENOFIBRIC ACID) 135 MG CPDR Take 1 capsule by mouth daily.   cyanocobalamin (,VITAMIN B-12,) 1000 MCG/ML injection Inject 1,000 mcg into the muscle every 30 (thirty) days.   diltiazem (CARDIZEM CD) 120 MG 24 hr capsule Take 1 capsule (120 mg total) by mouth daily.   divalproex (DEPAKOTE ER) 500 MG 24 hr tablet Take 500 mg by mouth at bedtime.    levothyroxine (SYNTHROID, LEVOTHROID) 50 MCG tablet Take 50 mcg by mouth daily before breakfast.    losartan (COZAAR) 50 MG tablet Take 1 tablet (50 mg total) by mouth daily.   metFORMIN (GLUCOPHAGE-XR) 500 MG 24 hr tablet Take 2,000 mg by mouth at bedtime.    metoprolol succinate (TOPROL-XL) 25 MG 24 hr tablet Take 1 tablet (25 mg total) by mouth daily.   Omega-3 Fatty Acids (FISH OIL) 1200 MG CAPS Take 2 capsules by mouth daily.   ondansetron (ZOFRAN) 8 MG tablet Take 8 mg by mouth every 8 (eight) hours as needed for nausea or vomiting.   OZEMPIC, 1 MG/DOSE, 4 MG/3ML SOPN Inject 1 mg into the skin once a week.   QUEtiapine (SEROQUEL XR) 400 MG 24 hr tablet Take 2 tablets by mouth at bedtime.   rosuvastatin (CRESTOR) 10 MG tablet Take 10 mg by mouth daily.   Vitamin D, Ergocalciferol, 50000 units CAPS Take 1 capsule by mouth once a week.   zolpidem (AMBIEN CR) 12.5 MG CR tablet Take 12.5 mg by mouth at bedtime as needed for sleep.     Allergies:   Bee venom, Codeine, and Penicillins   Social History   Socioeconomic History   Marital status: Divorced    Spouse name: Not on file   Number of children: 0   Years of education: Not on file   Highest education level: Bachelor's degree (e.g., BA, AB, BS)  Occupational History    Comment: disablitity   Tobacco Use   Smoking status: Former    Packs/day: 0.50    Years: 20.00    Pack years: 10.00    Types: Cigarettes    Quit date: 03/11/2013    Years since quitting: 8.8   Smokeless tobacco: Never  Vaping Use   Vaping Use: Never used  Substance and Sexual Activity   Alcohol use: Not Currently    Comment: occasional   Drug use: No   Sexual activity: Not Currently    Birth control/protection: None, Surgical    Comment: ablation 2014  Other Topics Concern   Not on file  Social History Narrative   Not on file   Social Determinants of Health   Financial Resource Strain: Not on file  Food Insecurity: Not on file  Transportation Needs:  Not on file  Physical Activity: Not on file  Stress: Not on file  Social Connections: Not on file     Family History: The patient's family history includes Depression in her maternal grandmother, maternal uncle, and mother; Drug abuse in her brother. There is no history of Breast cancer. ROS:   Please see the history of present illness.    All 14 point review of systems negative except as described per history of present illness  EKGs/Labs/Other Studies Reviewed:      Recent Labs: No results found for requested labs within last 8760 hours.  Recent Lipid Panel No results found for: CHOL, TRIG, HDL, CHOLHDL, VLDL, LDLCALC,  LDLDIRECT  Physical Exam:    VS:  BP 126/82 (BP Location: Right Arm, Patient Position: Sitting)   Pulse 100   Ht 5\' 7"  (1.702 m)   Wt 239 lb (108.4 kg)   SpO2 96%   BMI 37.43 kg/m     Wt Readings from Last 3 Encounters:  01/31/22 239 lb (108.4 kg)  07/13/21 228 lb (103.4 kg)  04/07/21 218 lb (98.9 kg)     GEN:  Well nourished, well developed in no acute distress HEENT: Normal NECK: No JVD; No carotid bruits LYMPHATICS: No lymphadenopathy CARDIAC: RRR, no murmurs, no rubs, no gallops RESPIRATORY:  Clear to auscultation without rales, wheezing or rhonchi  ABDOMEN: Soft, non-tender, non-distended MUSCULOSKELETAL:  No edema; No deformity  SKIN: Warm and dry LOWER EXTREMITIES: no swelling NEUROLOGIC:  Alert and oriented x 3 PSYCHIATRIC:  Normal affect   ASSESSMENT:    1. Sinus tachycardia   2. Mixed hyperlipidemia   3. Bipolar affective disorder, current episode manic without psychotic symptoms (HCC)   4. Primary hypertension    PLAN:    In order of problems listed above:  Sinus tachycardia with normal discrimination however she does takes Adderall which can cause tachycardia.  Scheduled for another echocardiogram actually structural heart is normal she does not develop tachycardia induced cardiomyopathy however I do not think she is at the  level of tachycardia that can cause it also it sinus tachycardia which is very unlikely because of this problem.  Mixed dyslipidemia I did review K PN which show LDL of 83 HDL 43.  This is from January of this year good cholesterol profile continue present management. Essential hypertension blood pressure well controlled continue present management. Bipolar disorder being followed by psychiatrist.   Medication Adjustments/Labs and Tests Ordered: Current medicines are reviewed at length with the patient today.  Concerns regarding medicines are outlined above.  No orders of the defined types were placed in this encounter.  Medication changes: No orders of the defined types were placed in this encounter.   Signed, February, MD, [facility] 01/31/2022 12:06 PM    [facility]

## 2022-04-11 DIAGNOSIS — I10 Essential (primary) hypertension: Secondary | ICD-10-CM | POA: Diagnosis not present

## 2022-04-11 DIAGNOSIS — E1169 Type 2 diabetes mellitus with other specified complication: Secondary | ICD-10-CM | POA: Diagnosis not present

## 2022-04-11 DIAGNOSIS — Z79899 Other long term (current) drug therapy: Secondary | ICD-10-CM | POA: Diagnosis not present

## 2022-04-11 DIAGNOSIS — E039 Hypothyroidism, unspecified: Secondary | ICD-10-CM | POA: Diagnosis not present

## 2022-04-12 DIAGNOSIS — E119 Type 2 diabetes mellitus without complications: Secondary | ICD-10-CM | POA: Diagnosis not present

## 2022-04-12 DIAGNOSIS — H43313 Vitreous membranes and strands, bilateral: Secondary | ICD-10-CM | POA: Diagnosis not present

## 2022-05-17 ENCOUNTER — Ambulatory Visit (HOSPITAL_COMMUNITY): Payer: Medicare Other | Attending: Cardiology

## 2022-05-17 DIAGNOSIS — R0609 Other forms of dyspnea: Secondary | ICD-10-CM | POA: Diagnosis not present

## 2022-05-17 LAB — ECHOCARDIOGRAM COMPLETE
Area-P 1/2: 5.42 cm2
S' Lateral: 3.2 cm

## 2022-06-14 ENCOUNTER — Other Ambulatory Visit: Payer: Self-pay | Admitting: Cardiology

## 2022-06-14 DIAGNOSIS — I10 Essential (primary) hypertension: Secondary | ICD-10-CM

## 2022-06-14 NOTE — Telephone Encounter (Signed)
Refill to pharmacy 

## 2022-09-07 DIAGNOSIS — H26493 Other secondary cataract, bilateral: Secondary | ICD-10-CM | POA: Diagnosis not present

## 2022-09-07 DIAGNOSIS — H26491 Other secondary cataract, right eye: Secondary | ICD-10-CM | POA: Diagnosis not present

## 2022-09-07 DIAGNOSIS — H18413 Arcus senilis, bilateral: Secondary | ICD-10-CM | POA: Diagnosis not present

## 2022-09-07 DIAGNOSIS — Z961 Presence of intraocular lens: Secondary | ICD-10-CM | POA: Diagnosis not present

## 2022-09-15 ENCOUNTER — Other Ambulatory Visit: Payer: Self-pay | Admitting: Cardiology

## 2022-09-15 DIAGNOSIS — R000 Tachycardia, unspecified: Secondary | ICD-10-CM

## 2022-10-06 DIAGNOSIS — E538 Deficiency of other specified B group vitamins: Secondary | ICD-10-CM | POA: Diagnosis not present

## 2022-10-06 DIAGNOSIS — I10 Essential (primary) hypertension: Secondary | ICD-10-CM | POA: Diagnosis not present

## 2022-10-06 DIAGNOSIS — Z23 Encounter for immunization: Secondary | ICD-10-CM | POA: Diagnosis not present

## 2022-10-06 DIAGNOSIS — E7800 Pure hypercholesterolemia, unspecified: Secondary | ICD-10-CM | POA: Diagnosis not present

## 2022-10-06 DIAGNOSIS — E559 Vitamin D deficiency, unspecified: Secondary | ICD-10-CM | POA: Diagnosis not present

## 2022-10-06 DIAGNOSIS — F319 Bipolar disorder, unspecified: Secondary | ICD-10-CM | POA: Diagnosis not present

## 2022-10-06 DIAGNOSIS — Z0001 Encounter for general adult medical examination with abnormal findings: Secondary | ICD-10-CM | POA: Diagnosis not present

## 2022-10-06 DIAGNOSIS — Z79899 Other long term (current) drug therapy: Secondary | ICD-10-CM | POA: Diagnosis not present

## 2022-10-06 DIAGNOSIS — E1169 Type 2 diabetes mellitus with other specified complication: Secondary | ICD-10-CM | POA: Diagnosis not present

## 2022-10-28 ENCOUNTER — Other Ambulatory Visit: Payer: Self-pay | Admitting: Cardiology

## 2022-12-29 ENCOUNTER — Telehealth: Payer: Self-pay | Admitting: Cardiology

## 2022-12-29 DIAGNOSIS — R000 Tachycardia, unspecified: Secondary | ICD-10-CM

## 2022-12-29 MED ORDER — METOPROLOL SUCCINATE ER 25 MG PO TB24: 25.0000 mg | ORAL_TABLET | Freq: Every day | ORAL | 0 refills | Status: DC

## 2022-12-29 MED ORDER — DILTIAZEM HCL ER COATED BEADS 120 MG PO CP24
120.0000 mg | ORAL_CAPSULE | Freq: Every day | ORAL | 0 refills | Status: AC
Start: 2022-12-29 — End: ?

## 2022-12-29 NOTE — Telephone Encounter (Signed)
Pt's medication was sent to pt's pharmacy as requested. Confirmation received.  °

## 2022-12-29 NOTE — Telephone Encounter (Signed)
*  STAT* If patient is at the pharmacy, call can be transferred to refill team.   1. Which medications need to be refilled? (please list name of each medication and dose if known) diltiazem (CARDIZEM CD) 120 MG 24 hr capsule ; metoprolol succinate (TOPROL-XL) 25 MG 24 hr tablet   2. Which pharmacy/location (including street and city if local pharmacy) is medication to be sent to? [facility] - [place], [place] - [address]   3. Do they need a 30 day or 90 day supply? 90

## 2023-02-06 ENCOUNTER — Other Ambulatory Visit: Payer: Self-pay | Admitting: Cardiology
